# Patient Record
Sex: Male | Born: 1975 | Race: Black or African American | Hispanic: No | Marital: Single | Smoking: Never smoker
Health system: Southern US, Community
[De-identification: ages and names within clinical notes are randomized; demographics above are authoritative.]

## PROBLEM LIST (undated history)

## (undated) DIAGNOSIS — E119 Type 2 diabetes mellitus without complications: Secondary | ICD-10-CM

## (undated) DIAGNOSIS — E785 Hyperlipidemia, unspecified: Secondary | ICD-10-CM

## (undated) DIAGNOSIS — I10 Essential (primary) hypertension: Secondary | ICD-10-CM

## (undated) HISTORY — DX: Hyperlipidemia, unspecified: E78.5

---

## 2005-02-22 ENCOUNTER — Emergency Department (HOSPITAL_COMMUNITY): Admission: EM | Admit: 2005-02-22 | Discharge: 2005-02-23 | Payer: Self-pay | Admitting: Emergency Medicine

## 2005-02-23 ENCOUNTER — Emergency Department (HOSPITAL_COMMUNITY): Admission: EM | Admit: 2005-02-23 | Discharge: 2005-02-23 | Payer: Self-pay | Admitting: Emergency Medicine

## 2010-06-28 ENCOUNTER — Emergency Department (HOSPITAL_COMMUNITY): Admission: EM | Admit: 2010-06-28 | Discharge: 2010-06-29 | Payer: Self-pay | Admitting: Emergency Medicine

## 2010-07-02 ENCOUNTER — Inpatient Hospital Stay (HOSPITAL_COMMUNITY): Admission: EM | Admit: 2010-07-02 | Discharge: 2010-07-04 | Payer: Self-pay | Admitting: Emergency Medicine

## 2010-07-21 ENCOUNTER — Encounter (INDEPENDENT_AMBULATORY_CARE_PROVIDER_SITE_OTHER): Payer: Self-pay | Admitting: *Deleted

## 2010-07-21 ENCOUNTER — Ambulatory Visit: Payer: Self-pay | Admitting: Internal Medicine

## 2010-07-21 LAB — CONVERTED CEMR LAB
Hgb A1c MFr Bld: 12.6 % — ABNORMAL HIGH (ref <5.7)
Microalb, Ur: 0.87 mg/dL (ref 0.00–1.89)

## 2010-07-31 ENCOUNTER — Emergency Department (HOSPITAL_COMMUNITY): Admission: EM | Admit: 2010-07-31 | Discharge: 2010-08-01 | Payer: Self-pay | Admitting: Emergency Medicine

## 2010-08-19 DIAGNOSIS — R109 Unspecified abdominal pain: Secondary | ICD-10-CM | POA: Insufficient documentation

## 2010-08-19 DIAGNOSIS — E669 Obesity, unspecified: Secondary | ICD-10-CM

## 2010-08-19 DIAGNOSIS — R5381 Other malaise: Secondary | ICD-10-CM

## 2010-08-19 DIAGNOSIS — K7689 Other specified diseases of liver: Secondary | ICD-10-CM

## 2010-08-19 DIAGNOSIS — I10 Essential (primary) hypertension: Secondary | ICD-10-CM | POA: Insufficient documentation

## 2010-08-19 DIAGNOSIS — E1165 Type 2 diabetes mellitus with hyperglycemia: Secondary | ICD-10-CM

## 2010-08-19 DIAGNOSIS — E1110 Type 2 diabetes mellitus with ketoacidosis without coma: Secondary | ICD-10-CM

## 2010-08-19 DIAGNOSIS — R630 Anorexia: Secondary | ICD-10-CM

## 2010-08-19 DIAGNOSIS — R5383 Other fatigue: Secondary | ICD-10-CM

## 2010-09-12 ENCOUNTER — Encounter (INDEPENDENT_AMBULATORY_CARE_PROVIDER_SITE_OTHER): Payer: Self-pay | Admitting: *Deleted

## 2010-11-24 NOTE — Letter (Signed)
Summary: Appointment - Missed  [facility], Main Office  [address]   [place], [place] [address]   Phone: [phone]  Fax: [phone]     September 12, 2010 MRN: [id number]   Daniel Ewing [address] [place], [place]  [address]   Dear Mr. KOMAN,  Our records indicate you missed your appointment in October with Dr. Clifton James.                                    It is very important that we reach you to reschedule this appointment. We look forward to participating in your health care needs. Please contact us at the number listed above at your earliest convenience to reschedule this appointment.     Sincerely, Neurosurgeon Team LG

## 2011-01-05 LAB — CBC
HCT: 43 % (ref 39.0–52.0)
HCT: 45.4 % (ref 39.0–52.0)
HCT: 53.3 % — ABNORMAL HIGH (ref 39.0–52.0)
HCT: 54.8 % — ABNORMAL HIGH (ref 39.0–52.0)
Hemoglobin: 14.3 g/dL (ref 13.0–17.0)
Hemoglobin: 18.3 g/dL — ABNORMAL HIGH (ref 13.0–17.0)
Hemoglobin: 18.4 g/dL — ABNORMAL HIGH (ref 13.0–17.0)
MCH: 29 pg (ref 26.0–34.0)
MCH: 28.7 pg (ref 26.0–34.0)
MCH: 29.2 pg (ref 26.0–34.0)
MCH: 29.3 pg (ref 26.0–34.0)
MCHC: 33.3 g/dL (ref 30.0–36.0)
MCHC: 33.6 g/dL (ref 30.0–36.0)
MCV: 86.9 fL (ref 78.0–100.0)
MCV: 85 fL (ref 78.0–100.0)
MCV: 85 fL (ref 78.0–100.0)
MCV: 85.6 fL (ref 78.0–100.0)
Platelets: 246 10*3/uL (ref 150–400)
Platelets: 189 10*3/uL (ref 150–400)
Platelets: 215 10*3/uL (ref 150–400)
Platelets: 229 10*3/uL (ref 150–400)
RBC: 4.94 MIL/uL (ref 4.22–5.81)
RBC: 5.34 MIL/uL (ref 4.22–5.81)
RBC: 5.74 MIL/uL (ref 4.22–5.81)
RBC: 6.27 MIL/uL — ABNORMAL HIGH (ref 4.22–5.81)
RBC: 6.41 MIL/uL — ABNORMAL HIGH (ref 4.22–5.81)
RDW: 14.7 % (ref 11.5–15.5)
RDW: 13.5 % (ref 11.5–15.5)
WBC: 9.2 10*3/uL (ref 4.0–10.5)
WBC: 6.8 10*3/uL (ref 4.0–10.5)
WBC: 7.5 10*3/uL (ref 4.0–10.5)
WBC: 9.1 10*3/uL (ref 4.0–10.5)
WBC: 9.2 10*3/uL (ref 4.0–10.5)

## 2011-01-05 LAB — POCT CARDIAC MARKERS
CKMB, poc: <1 ng/mL — ABNORMAL LOW (ref 1.0–8.0)
CKMB, poc: <1 ng/mL — ABNORMAL LOW (ref 1.0–8.0)
CKMB, poc: <1 ng/mL — ABNORMAL LOW (ref 1.0–8.0)
Myoglobin, poc: 38.6 ng/mL (ref 12–200)
Myoglobin, poc: 48.4 ng/mL (ref 12–200)
Myoglobin, poc: 106 ng/mL (ref 12–200)
Troponin i, poc: <0.05 ng/mL (ref 0.00–0.09)
Troponin i, poc: <0.05 ng/mL (ref 0.00–0.09)
Troponin i, poc: <0.05 ng/mL (ref 0.00–0.09)
Troponin i, poc: <0.05 ng/mL (ref 0.00–0.09)

## 2011-01-05 LAB — GLUCOSE, CAPILLARY
Glucose-Capillary: 137 mg/dL — ABNORMAL HIGH (ref 70–99)
Glucose-Capillary: 154 mg/dL — ABNORMAL HIGH (ref 70–99)
Glucose-Capillary: 158 mg/dL — ABNORMAL HIGH (ref 70–99)
Glucose-Capillary: 162 mg/dL — ABNORMAL HIGH (ref 70–99)
Glucose-Capillary: 166 mg/dL — ABNORMAL HIGH (ref 70–99)
Glucose-Capillary: 173 mg/dL — ABNORMAL HIGH (ref 70–99)
Glucose-Capillary: 193 mg/dL — ABNORMAL HIGH (ref 70–99)
Glucose-Capillary: 247 mg/dL — ABNORMAL HIGH (ref 70–99)
Glucose-Capillary: 250 mg/dL — ABNORMAL HIGH (ref 70–99)
Glucose-Capillary: 271 mg/dL — ABNORMAL HIGH (ref 70–99)
Glucose-Capillary: 276 mg/dL — ABNORMAL HIGH (ref 70–99)
Glucose-Capillary: 291 mg/dL — ABNORMAL HIGH (ref 70–99)
Glucose-Capillary: 303 mg/dL — ABNORMAL HIGH (ref 70–99)
Glucose-Capillary: 339 mg/dL — ABNORMAL HIGH (ref 70–99)
Glucose-Capillary: 366 mg/dL — ABNORMAL HIGH (ref 70–99)
Glucose-Capillary: 367 mg/dL — ABNORMAL HIGH (ref 70–99)
Glucose-Capillary: 376 mg/dL — ABNORMAL HIGH (ref 70–99)

## 2011-01-05 LAB — COMPREHENSIVE METABOLIC PANEL
ALT: 43 U/L (ref 0–53)
ALT: 51 U/L (ref 0–53)
AST: 38 U/L — ABNORMAL HIGH (ref 0–37)
AST: 48 U/L — ABNORMAL HIGH (ref 0–37)
Albumin: 3.9 g/dL (ref 3.5–5.2)
Albumin: 4.7 g/dL (ref 3.5–5.2)
Alkaline Phosphatase: 69 U/L (ref 39–117)
BUN: 12 mg/dL (ref 6–23)
CO2: 23 mEq/L (ref 19–32)
Calcium: 9.6 mg/dL (ref 8.4–10.5)
Chloride: 111 mEq/L (ref 96–112)
Chloride: 94 mEq/L — ABNORMAL LOW (ref 96–112)
Creatinine, Ser: 1.06 mg/dL (ref 0.4–1.5)
Creatinine, Ser: 1.4 mg/dL (ref 0.4–1.5)
GFR calc Af Amer: >60 mL/min (ref >60)
GFR calc Af Amer: >60 mL/min (ref >60)
GFR calc non Af Amer: 59 mL/min — ABNORMAL LOW (ref >60)
Glucose, Bld: 385 mg/dL — ABNORMAL HIGH (ref 70–99)
Potassium: 3.6 mEq/L (ref 3.5–5.1)
Potassium: 4.2 mEq/L (ref 3.5–5.1)
Sodium: 135 mEq/L (ref 135–145)
Sodium: 140 mEq/L (ref 135–145)
Total Bilirubin: 1.1 mg/dL (ref 0.3–1.2)
Total Bilirubin: 1.6 mg/dL — ABNORMAL HIGH (ref 0.3–1.2)
Total Protein: 8.8 g/dL — ABNORMAL HIGH (ref 6.0–8.3)

## 2011-01-05 LAB — DIFFERENTIAL
Basophils Absolute: 0 10*3/uL (ref 0.0–0.1)
Basophils Absolute: 0 10*3/uL (ref 0.0–0.1)
Basophils Relative: 0 % (ref 0–1)
Basophils Relative: 1 % (ref 0–1)
Eosinophils Absolute: 0.1 10*3/uL (ref 0.0–0.7)
Eosinophils Absolute: 0 10*3/uL (ref 0.0–0.7)
Eosinophils Absolute: 0 10*3/uL (ref 0.0–0.7)
Eosinophils Relative: 1 % (ref 0–5)
Eosinophils Relative: 0 % (ref 0–5)
Eosinophils Relative: 0 % (ref 0–5)
Lymphocytes Relative: 39 % (ref 12–46)
Lymphocytes Relative: 27 % (ref 12–46)
Lymphocytes Relative: 27 % (ref 12–46)
Lymphs Abs: 3.6 10*3/uL (ref 0.7–4.0)
Lymphs Abs: 2.5 10*3/uL (ref 0.7–4.0)
Lymphs Abs: 2.5 10*3/uL (ref 0.7–4.0)
Monocytes Absolute: 0.5 10*3/uL (ref 0.1–1.0)
Monocytes Absolute: 0.3 10*3/uL (ref 0.1–1.0)
Monocytes Absolute: 0.3 10*3/uL (ref 0.1–1.0)
Monocytes Relative: 5 % (ref 3–12)
Monocytes Relative: 3 % (ref 3–12)
Monocytes Relative: 4 % (ref 3–12)
Neutro Abs: 5 10*3/uL (ref 1.7–7.7)
Neutro Abs: 6.2 10*3/uL (ref 1.7–7.7)
Neutrophils Relative %: 54 % (ref 43–77)
Neutrophils Relative %: 68 % (ref 43–77)

## 2011-01-05 LAB — URINE CULTURE: Culture: NO GROWTH

## 2011-01-05 LAB — BASIC METABOLIC PANEL
BUN: 14 mg/dL (ref 6–23)
CO2: 25 mEq/L (ref 19–32)
CO2: 16 mEq/L — ABNORMAL LOW (ref 19–32)
Calcium: 9 mg/dL (ref 8.4–10.5)
Chloride: 107 mEq/L (ref 96–112)
Chloride: 114 mEq/L — ABNORMAL HIGH (ref 96–112)
Creatinine, Ser: 0.99 mg/dL (ref 0.4–1.5)
GFR calc Af Amer: >60 mL/min (ref >60)
GFR calc Af Amer: >60 mL/min (ref >60)
GFR calc non Af Amer: >60 mL/min (ref >60)
Glucose, Bld: 99 mg/dL (ref 70–99)
Potassium: 3.6 mEq/L (ref 3.5–5.1)
Potassium: 3.3 mEq/L — ABNORMAL LOW (ref 3.5–5.1)
Sodium: 138 mEq/L (ref 135–145)

## 2011-01-05 LAB — POCT I-STAT, CHEM 8
BUN: 17 mg/dL (ref 6–23)
Creatinine, Ser: 1 mg/dL (ref 0.4–1.5)
Potassium: 4.7 mEq/L (ref 3.5–5.1)
Sodium: 137 mEq/L (ref 135–145)
TCO2: 16 mmol/L (ref 0–100)

## 2011-01-05 LAB — URINALYSIS, ROUTINE W REFLEX MICROSCOPIC
Bilirubin Urine: NEGATIVE
Glucose, UA: >1000 mg/dL — AB
Glucose, UA: >1000 mg/dL — AB
Hgb urine dipstick: NEGATIVE
Ketones, ur: 40 mg/dL — AB
Ketones, ur: >80 mg/dL — AB
Leukocytes, UA: NEGATIVE
Leukocytes, UA: NEGATIVE
Nitrite: NEGATIVE
Nitrite: NEGATIVE
Protein, ur: 100 mg/dL — AB
Protein, ur: 30 mg/dL — AB
Specific Gravity, Urine: 1.037 — ABNORMAL HIGH (ref 1.005–1.030)
Specific Gravity, Urine: 1.041 — ABNORMAL HIGH (ref 1.005–1.030)
Urobilinogen, UA: 0.2 mg/dL (ref 0.0–1.0)
Urobilinogen, UA: 0.2 mg/dL (ref 0.0–1.0)
pH: 5 (ref 5.0–8.0)
pH: 5.5 (ref 5.0–8.0)

## 2011-01-05 LAB — URINE MICROSCOPIC-ADD ON

## 2011-01-05 LAB — BLOOD GAS, ARTERIAL
Acid-base deficit: 13.5 mmol/L — ABNORMAL HIGH (ref 0.0–2.0)
Drawn by: 235321
O2 Content: 2 L/min
O2 Saturation: 97.8 %
pCO2 arterial: 24.5 mmHg — ABNORMAL LOW (ref 35.0–45.0)
pO2, Arterial: 104 mmHg — ABNORMAL HIGH (ref 80.0–100.0)

## 2011-01-05 LAB — CARDIAC PANEL(CRET KIN+CKTOT+MB+TROPI)
CK, MB: 1.2 ng/mL (ref 0.3–4.0)
Relative Index: 1.1 (ref 0.0–2.5)
Troponin I: 0.02 ng/mL (ref 0.00–0.06)

## 2011-01-05 LAB — LIPASE, BLOOD: Lipase: 23 U/L (ref 11–59)

## 2011-06-06 ENCOUNTER — Emergency Department (HOSPITAL_COMMUNITY)
Admission: EM | Admit: 2011-06-06 | Discharge: 2011-06-07 | Disposition: A | Discharge status: Home / Self Care | Payer: PRIVATE HEALTH INSURANCE | Attending: Emergency Medicine | Admitting: Emergency Medicine

## 2011-06-06 DIAGNOSIS — E119 Type 2 diabetes mellitus without complications: Secondary | ICD-10-CM | POA: Insufficient documentation

## 2011-06-06 DIAGNOSIS — X58XXXA Exposure to other specified factors, initial encounter: Secondary | ICD-10-CM | POA: Insufficient documentation

## 2011-06-06 DIAGNOSIS — Z23 Encounter for immunization: Secondary | ICD-10-CM | POA: Insufficient documentation

## 2011-06-06 DIAGNOSIS — S91109A Unspecified open wound of unspecified toe(s) without damage to nail, initial encounter: Secondary | ICD-10-CM | POA: Insufficient documentation

## 2011-06-06 DIAGNOSIS — I10 Essential (primary) hypertension: Secondary | ICD-10-CM | POA: Insufficient documentation

## 2012-05-30 IMAGING — CT CT ANGIO CHEST
2 of 6 series · 19 of 36 positions shown · IV contrast (APPLIED)
Comparison: None.

CLINICAL DATA: Chest pain and shortness of breath.

CT ANGIOGRAPHY CHEST WITH CONTRAST
TECHNIQUE: Multidetector CT imaging of the chest was performed
using the standard protocol during bolus administration of
intravenous contrast.  Multiplanar CT image reconstructions
including MIPs were obtained to evaluate the vascular anatomy.
Contrast:  100 ml intravenous Rmnipaque-TCC

[Series 6: thins · axial · 0.74mm/px · z∈[-329,-73]mm · 18 of 286 slices shown]
[im 15/286  lung]
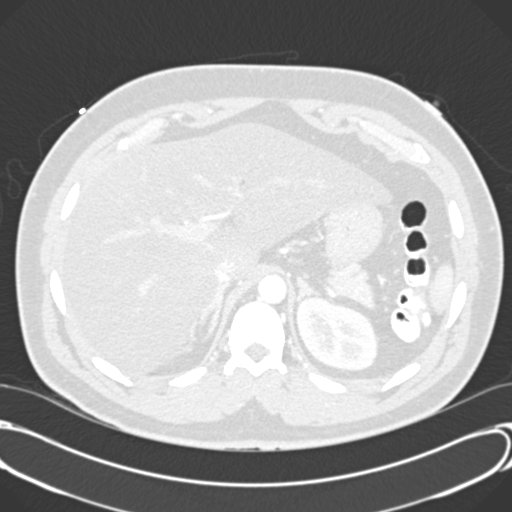
[im 29/286  mediastinal]
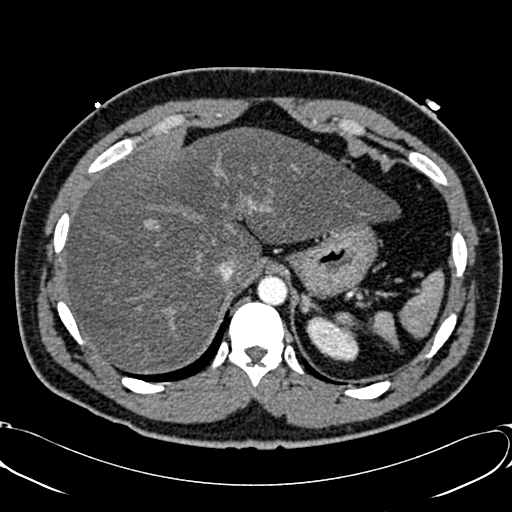
[im 43/286  lung]
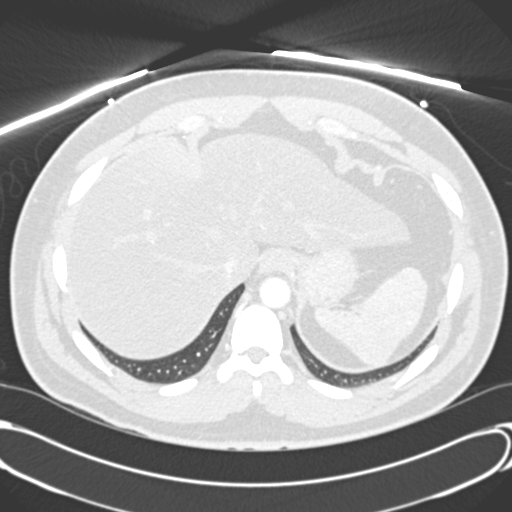
[im 58/286  mediastinal]
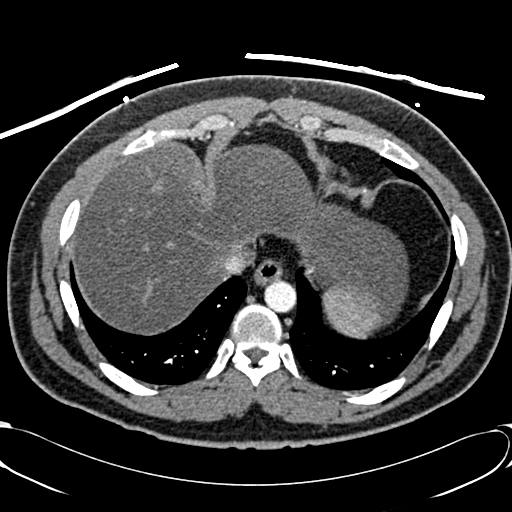
[im 72/286  lung]
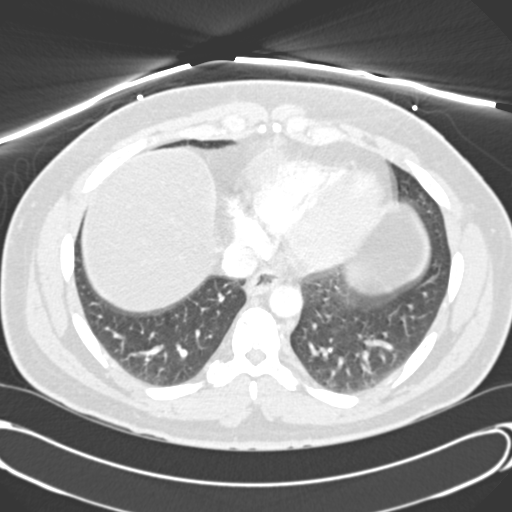
[im 86/286  mediastinal]
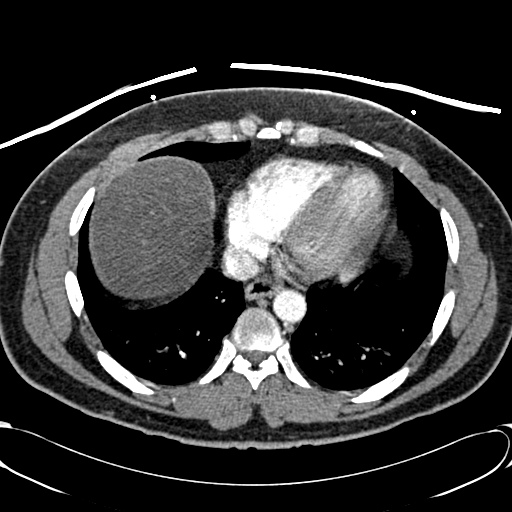
[im 100/286  lung]
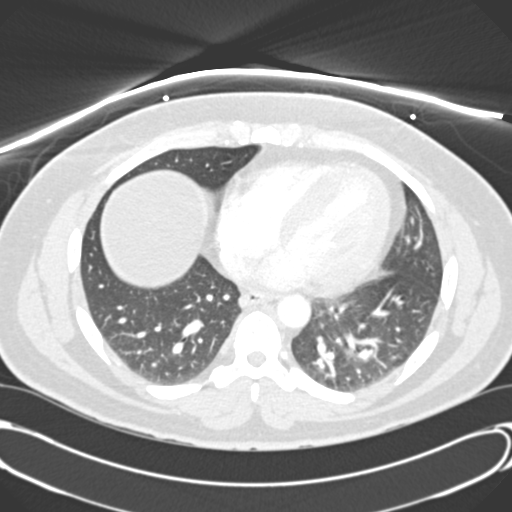
[im 115/286  mediastinal]
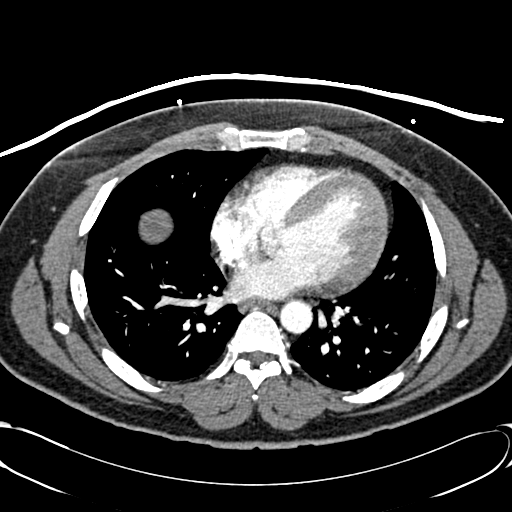
[im 129/286  lung]
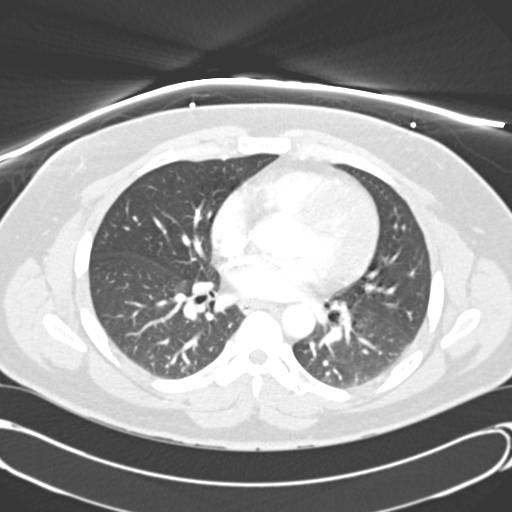
[im 157/286  mediastinal]
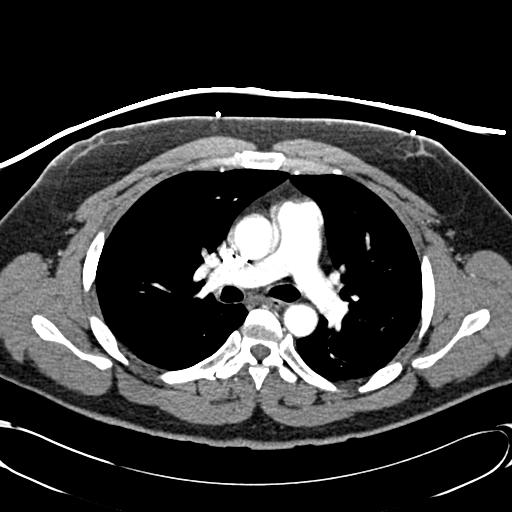
[im 172/286  lung]
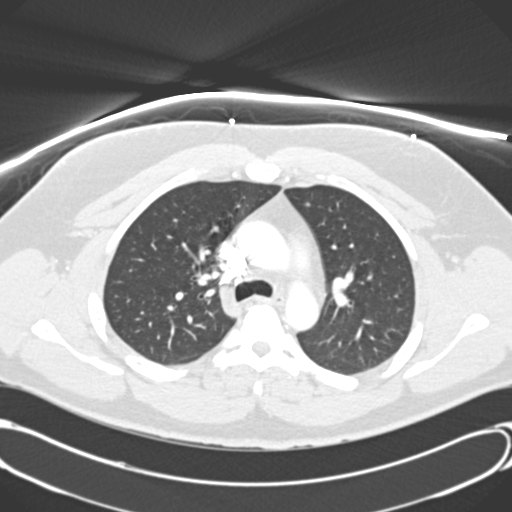
[im 186/286  mediastinal]
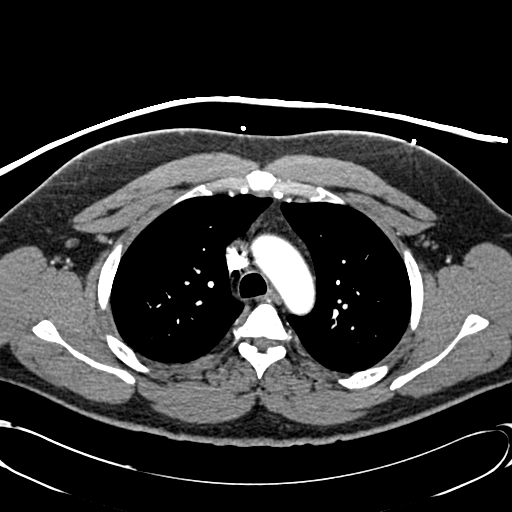
[im 200/286  lung]
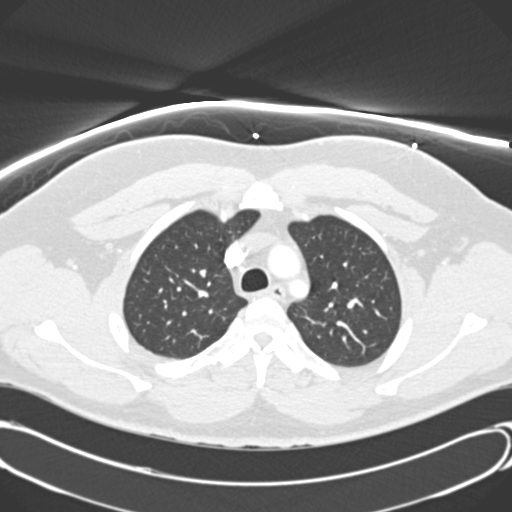
[im 214/286  mediastinal]
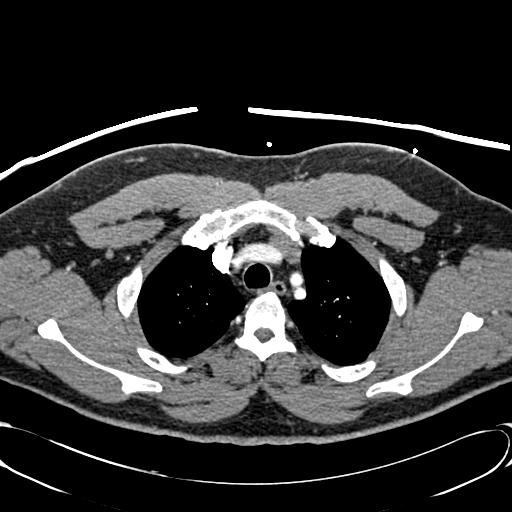
[im 229/286  lung]
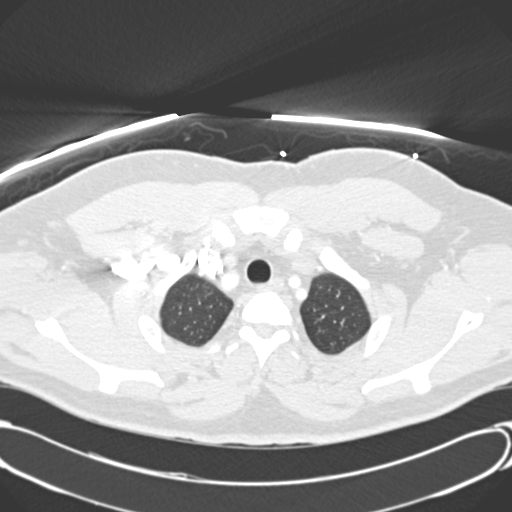
[im 243/286  mediastinal]
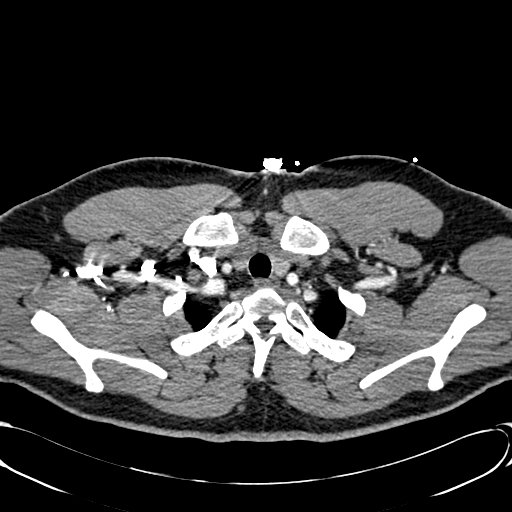
[im 257/286  lung]
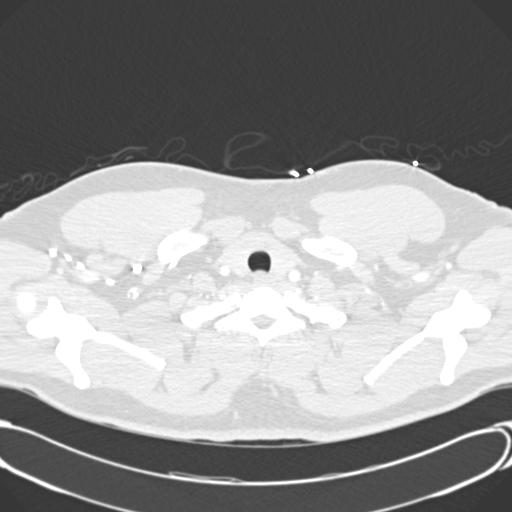
[im 271/286  mediastinal]
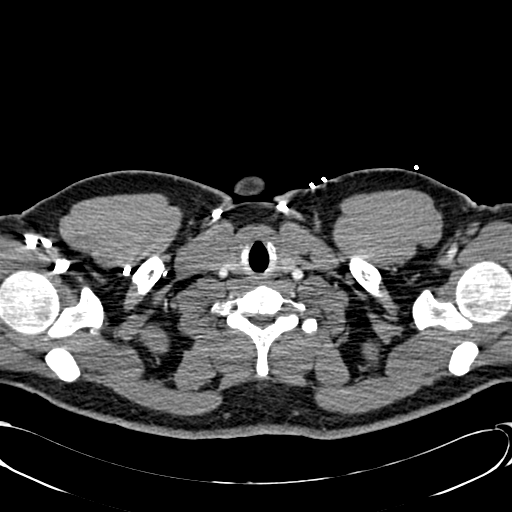

[Series 602: coronal chest · coronal · 0.74mm/px · 1 of 105 slices shown]
[im 53/105  mediastinal]
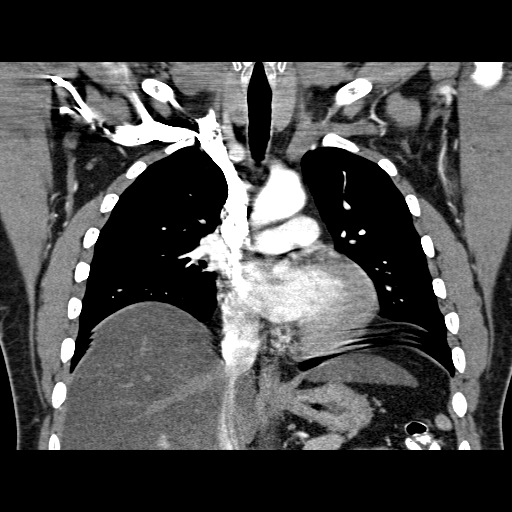

[19 of 36 positions shown; findings below may reference images not displayed]

FINDINGS: This is a technically adequate study.

No pulmonary emboli are identified.
There is no evidence of thoracic aortic aneurysm or dissection.
Mild cardiomegaly is identified.
The great vessels are within normal limits.
No pleural or pericardial effusions are identified.

The lungs are clear without evidence of nodules, masses, airspace
disease or consolidation.

Fatty infiltration of the liver is identified.
No acute or suspicious bony abnormalities are identified.

Review of the MIP images confirms the above findings.
IMPRESSION: No evidence of acute abnormality.  No evidence of pulmonary emboli
or thoracic aortic aneurysm/dissection.

Fatty infiltration of the liver.

Mild cardiomegaly.

## 2012-05-31 IMAGING — US US ABDOMEN COMPLETE
1 series · 14 of 25 positions shown · non-contrast
Comparison: None.

CLINICAL DATA: Abdominal pain.  Nausea and vomiting.  Diabetes and
obesity.

ABDOMINAL ULTRASOUND COMPLETE

[Series 1: us abdomen complete · 0.30mm/px · 14 of 45 slices shown]
[im 1/45]
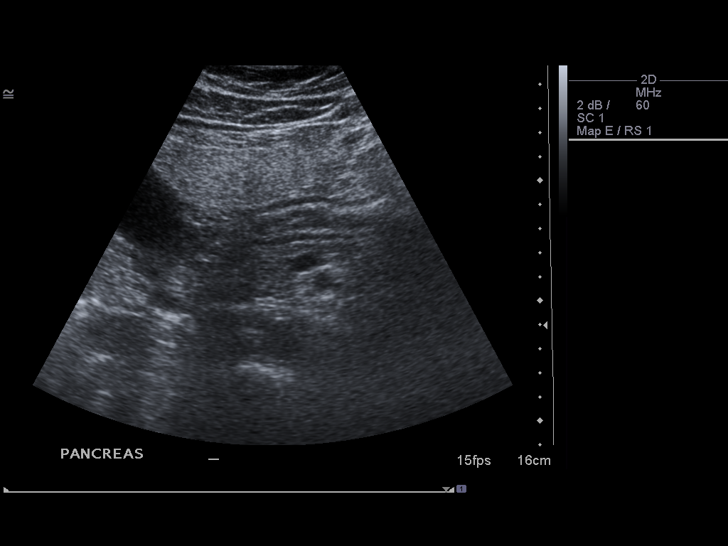
[im 4/45]
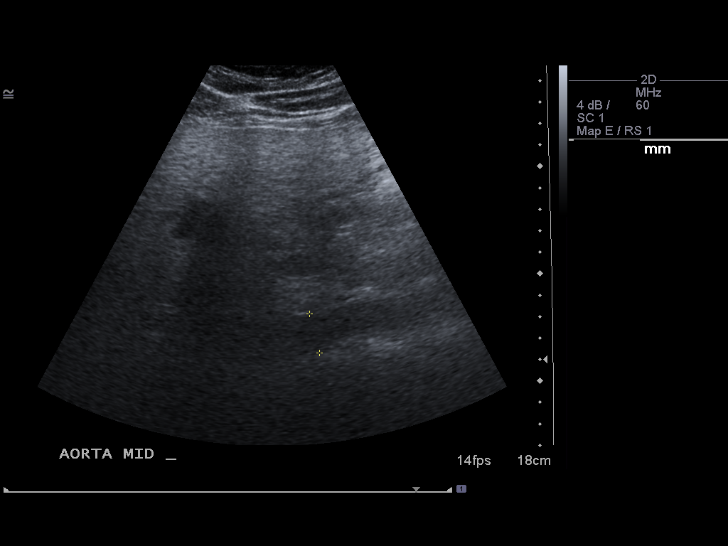
[im 8/45]
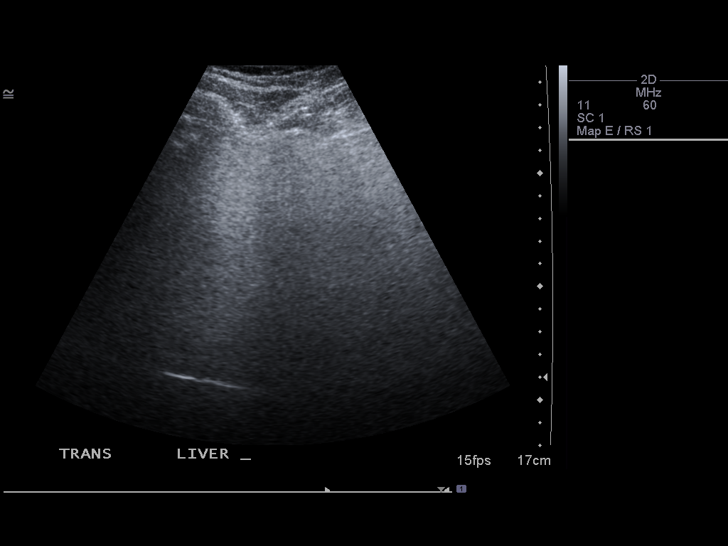
[im 12/45]
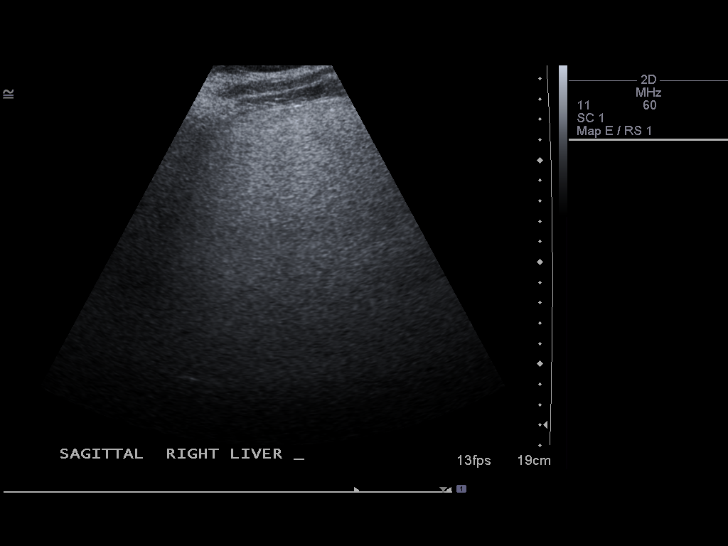
[im 15/45]
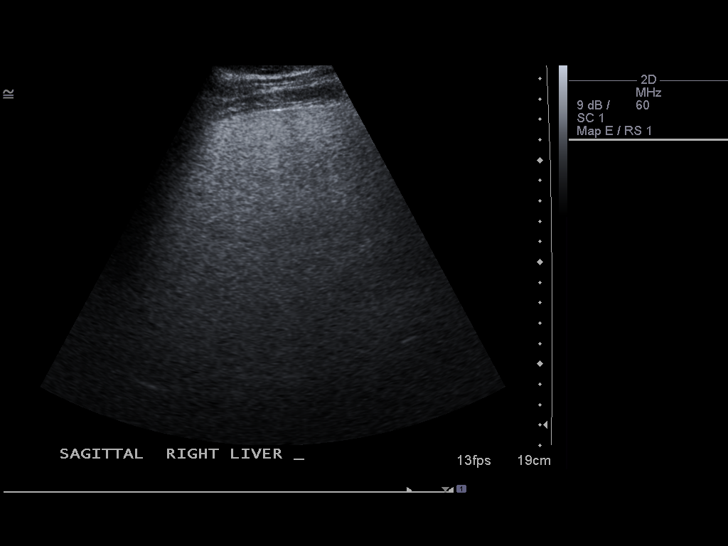
[im 17/45]
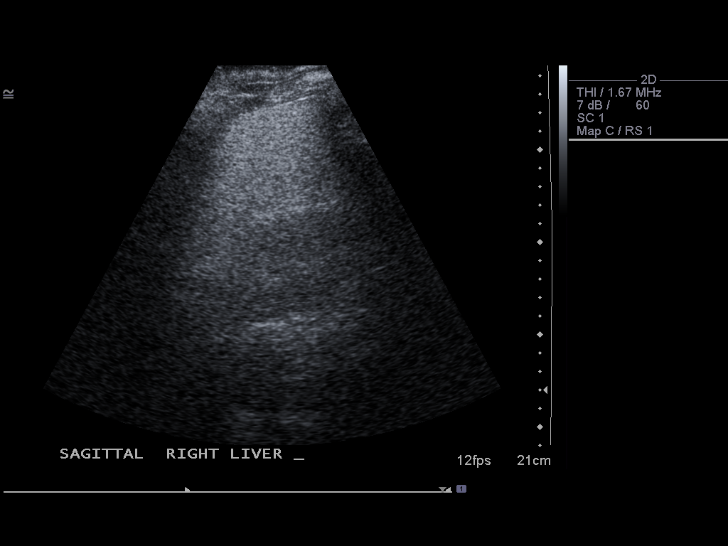
[im 21/45]
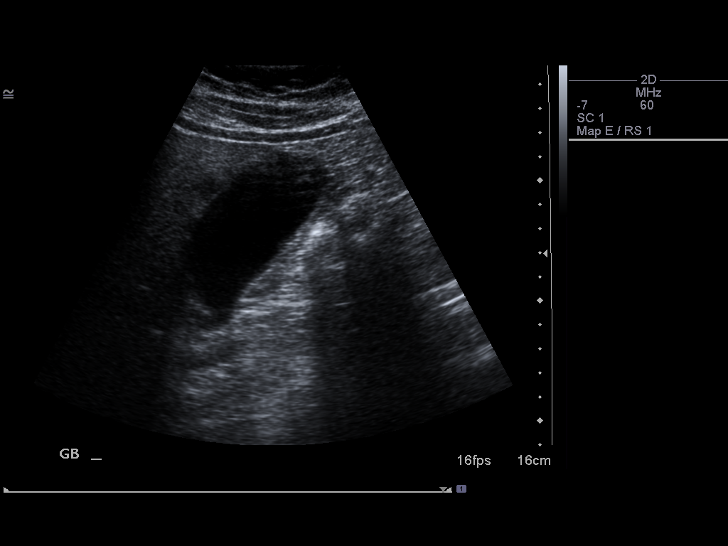
[im 24/45]
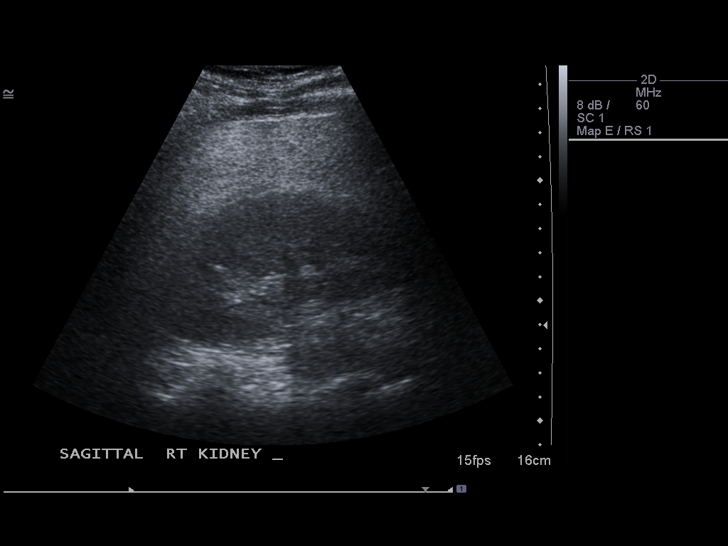
[im 28/45]
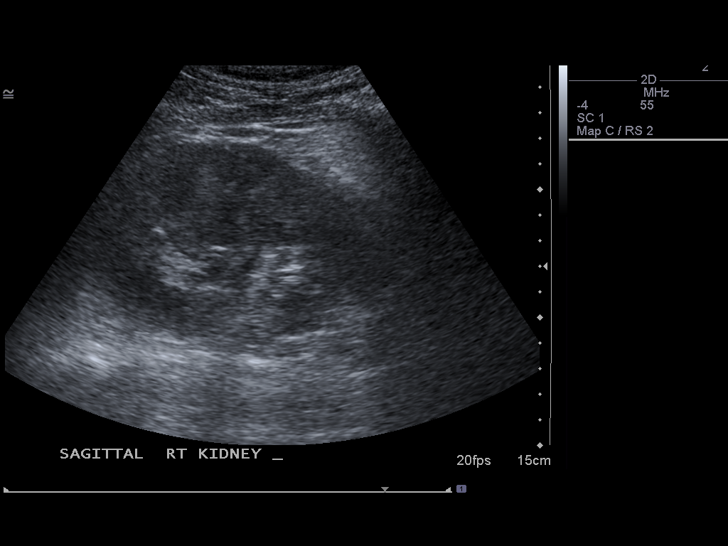
[im 30/45]
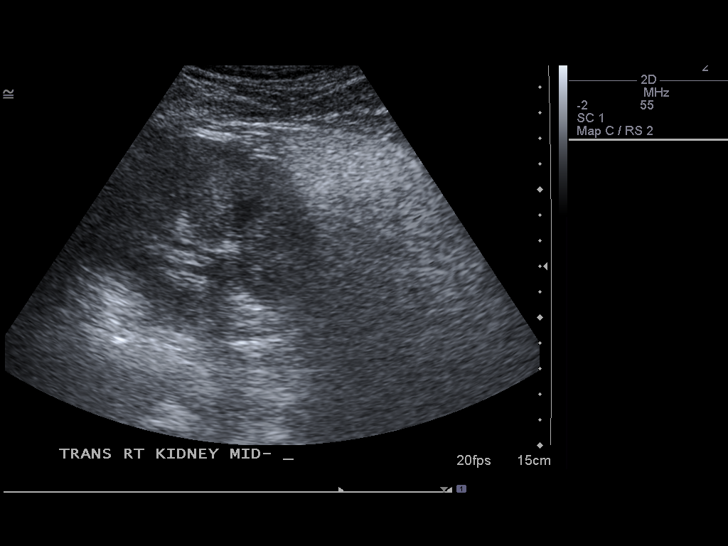
[im 34/45]
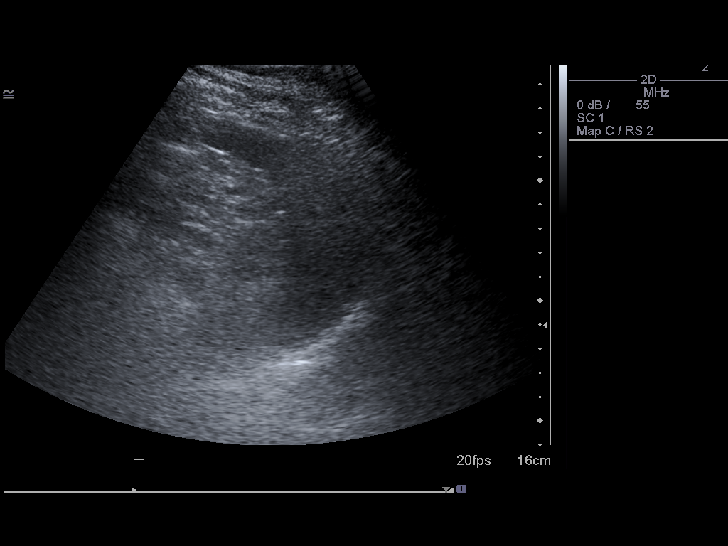
[im 37/45]
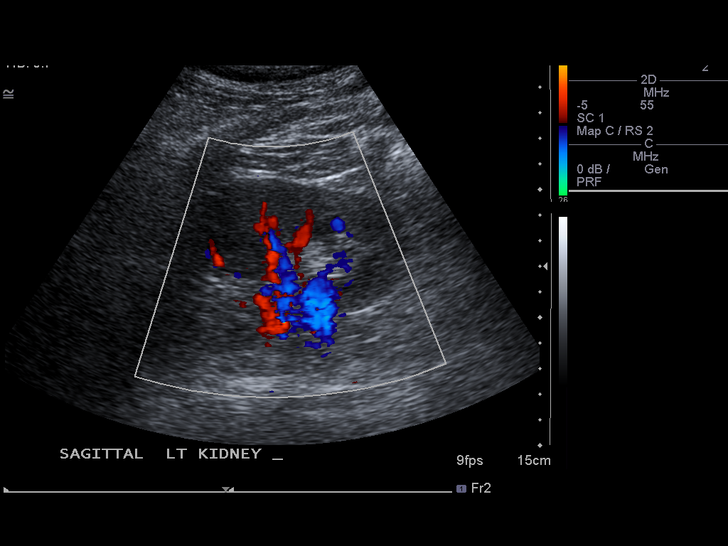
[im 41/45]
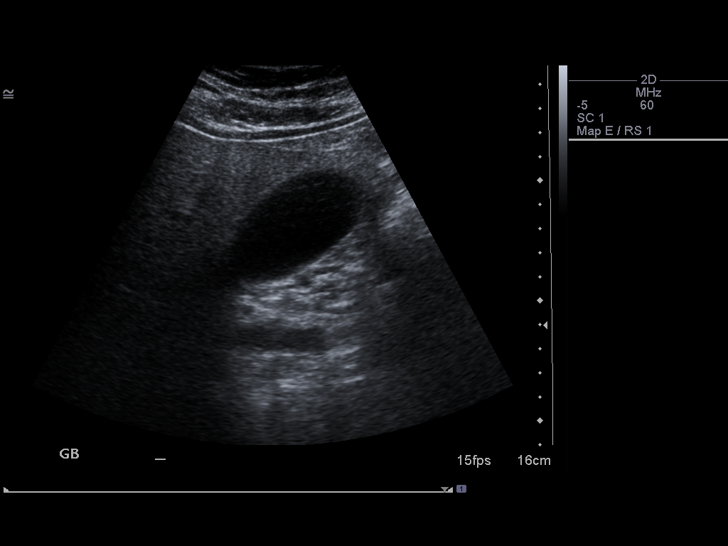
[im 45/45]
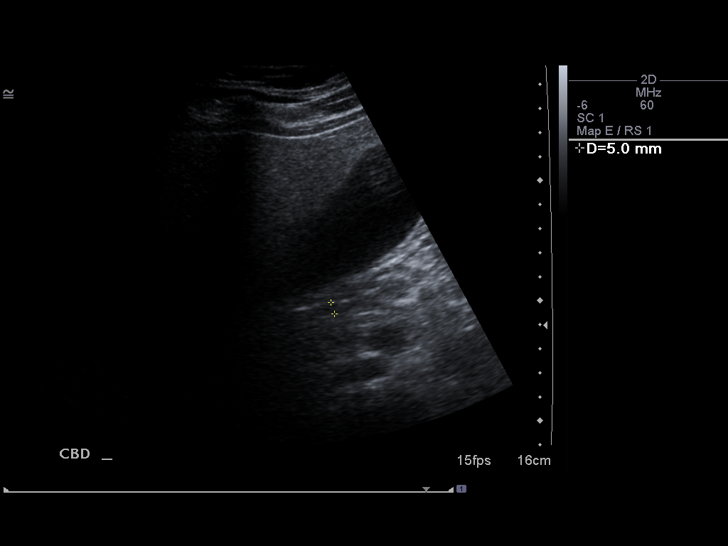

[14 of 25 positions shown; findings below may reference images not displayed]

FINDINGS: Gallbladder:  No gallstones, gallbladder wall thickening, or
pericholecystic fluid.

Common Bile Duct:  Within normal limits in caliber. Measures 5 mm
in diameter.

Liver: Diffusely increased parenchymal echogenicity, consistent
with hepatic steatosis.  No focal liver mass identified.

IVC:  Appears normal.

Pancreas:  Not well visualized due to overlying bowel gas.

Spleen:  Within normal limits in size and echotexture.

Right kidney:  Normal in size and parenchymal echogenicity.  No
evidence of mass or hydronephrosis.

Left kidney:  Normal in size and parenchymal echogenicity.  No
evidence of mass or hydronephrosis.

Abdominal Aorta:  No aneurysm identified.
IMPRESSION: 1.  No evidence of gallstones or other acute findings.
2.  Hepatic steatosis.

## 2012-10-01 ENCOUNTER — Emergency Department (HOSPITAL_COMMUNITY): Payer: No Typology Code available for payment source

## 2012-10-01 ENCOUNTER — Emergency Department (HOSPITAL_COMMUNITY)
Admission: EM | Admit: 2012-10-01 | Discharge: 2012-10-01 | Disposition: A | Discharge status: Home / Self Care | Payer: No Typology Code available for payment source | Attending: Emergency Medicine | Admitting: Emergency Medicine

## 2012-10-01 ENCOUNTER — Encounter (HOSPITAL_COMMUNITY): Payer: Self-pay | Admitting: Emergency Medicine

## 2012-10-01 DIAGNOSIS — Z791 Long term (current) use of non-steroidal anti-inflammatories (NSAID): Secondary | ICD-10-CM | POA: Insufficient documentation

## 2012-10-01 DIAGNOSIS — Y92410 Unspecified street and highway as the place of occurrence of the external cause: Secondary | ICD-10-CM | POA: Insufficient documentation

## 2012-10-01 DIAGNOSIS — M542 Cervicalgia: Secondary | ICD-10-CM

## 2012-10-01 DIAGNOSIS — Z79899 Other long term (current) drug therapy: Secondary | ICD-10-CM | POA: Insufficient documentation

## 2012-10-01 DIAGNOSIS — M25519 Pain in unspecified shoulder: Secondary | ICD-10-CM

## 2012-10-01 DIAGNOSIS — I10 Essential (primary) hypertension: Secondary | ICD-10-CM | POA: Insufficient documentation

## 2012-10-01 DIAGNOSIS — S4980XA Other specified injuries of shoulder and upper arm, unspecified arm, initial encounter: Secondary | ICD-10-CM | POA: Insufficient documentation

## 2012-10-01 DIAGNOSIS — S46909A Unspecified injury of unspecified muscle, fascia and tendon at shoulder and upper arm level, unspecified arm, initial encounter: Secondary | ICD-10-CM | POA: Insufficient documentation

## 2012-10-01 DIAGNOSIS — E119 Type 2 diabetes mellitus without complications: Secondary | ICD-10-CM | POA: Insufficient documentation

## 2012-10-01 DIAGNOSIS — Y939 Activity, unspecified: Secondary | ICD-10-CM | POA: Insufficient documentation

## 2012-10-01 HISTORY — DX: Type 2 diabetes mellitus without complications: E11.9

## 2012-10-01 HISTORY — DX: Essential (primary) hypertension: I10

## 2012-10-01 MED ORDER — METHOCARBAMOL 500 MG PO TABS: 500.0000 mg | ORAL_TABLET | Freq: Two times a day (BID) | ORAL | Status: DC

## 2012-10-01 MED ORDER — HYDROCODONE-ACETAMINOPHEN 5-325 MG PO TABS: 1.0000 | ORAL_TABLET | ORAL | Status: DC | PRN

## 2012-10-01 MED ORDER — ACETAMINOPHEN 325 MG PO TABS
650.0000 mg | ORAL_TABLET | Freq: Once | ORAL | Status: AC
  Administered 2012-10-01: 650 mg via ORAL
  Filled 2012-10-01: qty 2

## 2012-10-01 MED ORDER — IBUPROFEN 800 MG PO TABS: 800.0000 mg | ORAL_TABLET | Freq: Three times a day (TID) | ORAL | Status: DC

## 2012-10-01 NOTE — ED Provider Notes (Signed)
Medical screening examination/treatment/procedure(s) were performed by non-physician practitioner and as supervising physician I was immediately available for consultation/collaboration.  Justine Cossin R. Renny Remer, MD 10/01/12 1631 
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
## 2012-10-01 NOTE — ED Notes (Signed)
CBG=101, Marlon Pel, PA aware, will monitor.
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
## 2015-12-03 ENCOUNTER — Emergency Department (HOSPITAL_COMMUNITY): Payer: PRIVATE HEALTH INSURANCE

## 2015-12-03 ENCOUNTER — Emergency Department (HOSPITAL_COMMUNITY)
Admission: EM | Admit: 2015-12-03 | Discharge: 2015-12-03 | Disposition: A | Discharge status: Home / Self Care | Payer: PRIVATE HEALTH INSURANCE | Attending: Emergency Medicine | Admitting: Emergency Medicine

## 2015-12-03 ENCOUNTER — Encounter (HOSPITAL_COMMUNITY): Payer: Self-pay

## 2015-12-03 DIAGNOSIS — Z79899 Other long term (current) drug therapy: Secondary | ICD-10-CM | POA: Insufficient documentation

## 2015-12-03 DIAGNOSIS — Z7984 Long term (current) use of oral hypoglycemic drugs: Secondary | ICD-10-CM | POA: Insufficient documentation

## 2015-12-03 DIAGNOSIS — M79671 Pain in right foot: Secondary | ICD-10-CM

## 2015-12-03 DIAGNOSIS — E119 Type 2 diabetes mellitus without complications: Secondary | ICD-10-CM | POA: Insufficient documentation

## 2015-12-03 DIAGNOSIS — I10 Essential (primary) hypertension: Secondary | ICD-10-CM | POA: Insufficient documentation

## 2015-12-03 DIAGNOSIS — Z7982 Long term (current) use of aspirin: Secondary | ICD-10-CM | POA: Insufficient documentation

## 2015-12-03 DIAGNOSIS — M722 Plantar fascial fibromatosis: Secondary | ICD-10-CM | POA: Insufficient documentation

## 2015-12-03 MED ORDER — NAPROXEN 500 MG PO TABS: 500.0000 mg | ORAL_TABLET | Freq: Two times a day (BID) | ORAL | Status: DC

## 2015-12-03 MED ORDER — NAPROXEN 500 MG PO TABS
500.0000 mg | ORAL_TABLET | Freq: Once | ORAL | Status: AC
  Administered 2015-12-03: 500 mg via ORAL
  Filled 2015-12-03: qty 1

## 2015-12-03 NOTE — ED Provider Notes (Signed)
CSN: [id number]     Arrival date & time 12/03/15  4098 History   First MD Initiated Contact with Patient 12/03/15 [phone]     Chief Complaint  Patient presents with  . Foot Pain   (Consider location/radiation/quality/duration/timing/severity/associated sxs/prior Treatment) Patient is a 40 y.o. male presenting with lower extremity pain. The history is provided by the patient. No language interpreter was used.  Foot Pain Associated symptoms include arthralgias. Pertinent negatives include no joint swelling or numbness.   Mr. Zentner is a 40 y.o male with a history of diabetes and hypertension who presents with sudden onset right foot pain after waking up from a nap. States he has not been able to a blade on it. Thursday the pain became worse. Worse taking the first step. 10/10 pain with ambulation. Has been using crutches the last 2 days. States he took Excedrin with last dose yesterday night. Denies any injury. Denies any swelling, bruising, numbness. Last A1c was 6.1. Denies any history of diabetic neuropathy. States he wears crocs or Nike tennis shoes. Professor by trade.   Past Medical History  Diagnosis Date  . Diabetes mellitus without complication (HCC)   . Hypertension    History reviewed. No pertinent past surgical history. Family History  Problem Relation Age of Onset  . Cancer Father    Social History  Substance Use Topics  . Smoking status: Never Smoker   . Smokeless tobacco: Never Used  . Alcohol Use: No    Review of Systems  Musculoskeletal: Positive for arthralgias. Negative for joint swelling.  Skin: Negative for wound.  Neurological: Negative for numbness.      Allergies  Review of patient's allergies indicates no known allergies.  Home Medications   Prior to Admission medications   Medication Sig Start Date End Date Taking? Authorizing Provider  aspirin 81 MG tablet Take 81 mg by mouth daily.   Yes Historical Provider, MD  Aspirin-Acetaminophen-Caffeine  (GOODY HEADACHE PO) Take 1 each by mouth.   Yes Historical Provider, MD  calcium & magnesium carbonates (MYLANTA) 478-295 MG tablet Take 1 tablet by mouth daily.   Yes Historical Provider, MD  lisinopril-hydrochlorothiazide (PRINZIDE,ZESTORETIC) 20-25 MG per tablet Take 1 tablet by mouth daily.   Yes Historical Provider, MD  metFORMIN (GLUCOPHAGE) 500 MG tablet Take 500 mg by mouth 2 (two) times daily with a meal.   Yes Historical Provider, MD  Omega-3 Fatty Acids (FISH OIL) 1000 MG CAPS Take 1,000 mg by mouth 3 (three) times daily.   Yes Historical Provider, MD  naproxen (NAPROSYN) 500 MG tablet Take 1 tablet (500 mg total) by mouth 2 (two) times daily. 12/03/15   Jerimey Burridge Patel-Mills, PA-C   BP 143/104 mmHg  Pulse 99  Temp(Src) 98.3 F (36.8 C) (Oral)  Resp 16  SpO2 99% Physical Exam  Constitutional: He is oriented to person, place, and time. He appears well-developed and well-nourished.  HENT:  Head: Normocephalic and atraumatic.  Eyes: Conjunctivae are normal.  Neck: Normal range of motion. Neck supple.  Cardiovascular: Normal rate.   Pulmonary/Chest: Effort normal.  Musculoskeletal: Normal range of motion. He exhibits tenderness. He exhibits no edema.  Right foot: 2+ DP pulse. Able to dorsi and plantar flex but with pain. Tenderness to the dorsum along the metatarsals and along the fifth metatarsal on the volar surface. No obvious deformity. Able to flex and extend toes. Less than 2 seconds capillary refill.  Neurological: He is alert and oriented to person, place, and time.  Skin: Skin is warm  and dry.  Psychiatric: He has a normal mood and affect.  Nursing note and vitals reviewed.   ED Course  Procedures (including critical care time) Labs Review Labs Reviewed - No data to display  Imaging Review Dg Foot Complete Right  12/03/2015  CLINICAL DATA:  Right lateral foot pain has been getting worse. EXAM: RIGHT FOOT COMPLETE - 3+ VIEW COMPARISON:  None. FINDINGS: There is no  evidence of fracture or dislocation. There is no evidence of arthropathy or other focal bone abnormality. Soft tissues are unremarkable. IMPRESSION: Negative. Electronically Signed   By: Elige Ko   On: 12/03/2015 09:33   I have personally reviewed and evaluated these image results as part of my medical decision-making.   EKG Interpretation None      MDM   Final diagnoses:  Foot pain, right  Plantar fasciitis of right foot   Patients symptoms consistent with plantar fascitis.  He is diabetic but has well controlled glucose and no peripheral neuropathy. No signs of infection.  Worse with first step after taking a nap. Xray is negative for any acute findings. I discussed conservative methods with him.  I also gave him naproxen and follow up with ortho.  Return precautions discussed and patient agrees with plan.  Medications  naproxen (NAPROSYN) tablet 500 mg (500 mg Oral Given 12/03/15 0929)   Filed Vitals:   12/03/15 0856  BP: 143/104  Pulse: 99  Temp: 98.3 F (36.8 C)  Resp: [address] PA-C 12/04/15 1610  Arby Barrette, MD 12/08/15 1032

## 2015-12-03 NOTE — Discharge Instructions (Signed)
Plantar Fasciitis Follow up with orthopedics. Try conservative treatments first such as running a frozen water bottle under your foot.  Plantar fasciitis is a painful foot condition that affects the heel. It occurs when the band of tissue that connects the toes to the heel bone (plantar fascia) becomes irritated. This can happen after exercising too much or doing other repetitive activities (overuse injury). The pain from plantar fasciitis can range from mild irritation to severe pain that makes it difficult for you to walk or move. The pain is usually worse in the morning or after you have been sitting or lying down for a while. CAUSES This condition may be caused by:  Standing for long periods of time.  Wearing shoes that do not fit.  Doing high-impact activities, including running, aerobics, and ballet.  Being overweight.  Having an abnormal way of walking (gait).  Having tight calf muscles.  Having high arches in your feet.  Starting a new athletic activity. SYMPTOMS The main symptom of this condition is heel pain. Other symptoms include:  Pain that gets worse after activity or exercise.  Pain that is worse in the morning or after resting.  Pain that goes away after you walk for a few minutes. DIAGNOSIS This condition may be diagnosed based on your signs and symptoms. Your health care provider will also do a physical exam to check for:  A tender area on the bottom of your foot.  A high arch in your foot.  Pain when you move your foot.  Difficulty moving your foot. You may also need to have imaging studies to confirm the diagnosis. These can include:  X-rays.  Ultrasound.  MRI. TREATMENT  Treatment for plantar fasciitis depends on the severity of the condition. Your treatment may include:  Rest, ice, and over-the-counter pain medicines to manage your pain.  Exercises to stretch your calves and your plantar fascia.  A splint that holds your foot in a stretched,  upward position while you sleep (night splint).  Physical therapy to relieve symptoms and prevent problems in the future.  Cortisone injections to relieve severe pain.  Extracorporeal shock wave therapy (ESWT) to stimulate damaged plantar fascia with electrical impulses. It is often used as a last resort before surgery.  Surgery, if other treatments have not worked after 12 months. HOME CARE INSTRUCTIONS  Take medicines only as directed by your health care provider.  Avoid activities that cause pain.  Roll the bottom of your foot over a bag of ice or a bottle of cold water. Do this for 20 minutes, 3-4 times a day.  Perform simple stretches as directed by your health care provider.  Try wearing athletic shoes with air-sole or gel-sole cushions or soft shoe inserts.  Wear a night splint while sleeping, if directed by your health care provider.  Keep all follow-up appointments with your health care provider. PREVENTION   Do not perform exercises or activities that cause heel pain.  Consider finding low-impact activities if you continue to have problems.  Lose weight if you need to. The best way to prevent plantar fasciitis is to avoid the activities that aggravate your plantar fascia. SEEK MEDICAL CARE IF:  Your symptoms do not go away after treatment with home care measures.  Your pain gets worse.  Your pain affects your ability to move or do your daily activities.   This information is not intended to replace advice given to you by your health care provider. Make sure you discuss any questions  you have with your health care provider.   Document Released: 07/04/2001 Document Revised: 06/30/2015 Document Reviewed: 08/19/2014 Elsevier Interactive Patient Education [employer]

## 2015-12-03 NOTE — ED Notes (Addendum)
Patient states he woke yesterday with right foot pain. Patient states today is unable to bear weight on the right foot without having severe pain. Patient states he last took Excedrin last night at 2245 with very little relief. Patient denies any injury to the right foot. No swelling or bruising noted.

## 2018-07-03 ENCOUNTER — Encounter: Payer: Self-pay | Admitting: Cardiology

## 2018-07-09 NOTE — Progress Notes (Signed)
Cardiology Office Note   Date:  07/10/2018   ID:  Daniel HaroldAnwar F Fessel, DOB 07/06/1976, MRN [id number]  PCP:  Diamantina ProvidenceAnderson, Takela N, FNP  Cardiologist:   Spike Desilets [place], MD   Chief Complaint  Patient presents with  . Chest Pain  . Palpitations      History of Present Illness: Daniel Haroldnwar F Ewing is a 42 y.o. male who is seen at the request of Dayton Scrapeakela  Anderson FNP for evaluation of palpitations, atrial fibrillation, and chest pain. He has a history of DM, HLD, and HTN. He reports he was in usual state of health until 2 weeks ago when he developed a sharp sudden pain in his chest while bending over. He also notes an irregular heart beat. He reports DM diagnosed in 2011. He has HTN which was controlled with lisinopril HCT. On statin for cholesterol. When seen at primary care office Ecg showed atrial fibrillation with rate 145 bpm. ST-T changes c/w inferior ischemia. He was started on metoprolol but has only been taking this once a day. Reports lisinopril HCT was stopped. Reports chest pain resolved. No dyspnea or dizziness. Prior to this was doing a cardio work out 5x/week. No history of murmur, bleeding, TIA, or CVA symptoms. Avoids caffeine. No Etoh or tobacco use. 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    Past Medical History:  Diagnosis Date  . Diabetes mellitus without complication (HCC)   . Hyperlipidemia   . Hypertension     History reviewed. No pertinent surgical history.   Current Outpatient Medications  Medication Sig Dispense Refill  . aspirin 81 MG tablet Take 81 mg by mouth daily.    . Aspirin-Acetaminophen-Caffeine (GOODY HEADACHE PO) Take 1 each by mouth.    Marland Kitchen. atorvastatin (LIPITOR) 10 MG tablet Take 10 mg by mouth daily at 6 PM.    . metFORMIN (GLUCOPHAGE) 500 MG tablet Take 500 mg by mouth 2 (two) times daily with a meal.    . metoprolol tartrate (LOPRESSOR) 25 MG tablet Take 25 mg by mouth 2 (two) times daily.    . Omega-3 Fatty Acids (FISH OIL) 1000 MG CAPS Take 1,000 mg  by mouth 3 (three) times daily.    Marland Kitchen. lisinopril-hydrochlorothiazide (PRINZIDE,ZESTORETIC) 20-25 MG tablet Take 1 tablet by mouth daily.     No current facility-administered medications for this visit.     Allergies:   Patient has no known allergies.    Social History:  The patient  reports that he has never smoked. He has never used smokeless tobacco. He reports that he does not drink alcohol or use drugs.   Family History:  The patient's family history includes Cancer in his brother and father; Diabetes in his father; Heart disease in his father; Hypertension in his father.    ROS:  Please see the history of present illness.   Otherwise, review of systems are positive for none.   All other systems are reviewed and negative.    PHYSICAL EXAM: VS:  BP (!) 172/99   Pulse (!) 104   Ht 5\' 9"  (1.753 m)   Wt 257 lb 9.6 oz (116.8 kg)   BMI 38.04 kg/m  , BMI Body mass index is 38.04 kg/m. GEN: Well nourished, obese BM, in no acute distress  HEENT: normal  Neck: no JVD, carotid bruits, or masses Cardiac: RRR with occ extrasystole; no murmurs, rubs, or gallops,no edema  Respiratory:  clear to auscultation bilaterally, normal work of breathing GI: soft, nontender, nondistended, + BS MS: no  deformity or atrophy  Skin: warm and dry, no rash Neuro:  Strength and sensation are intact Psych: euthymic mood, full affect   EKG:  EKG is ordered today. The ekg ordered today demonstrates NSR with occ to frequent PVCs. Diffuse nonspecific ST-T wave abnormality. I have personally reviewed and interpreted this study.    Recent Labs: No results found for requested labs within last 8760 hours.    Lipid Panel No results found for: CHOL, TRIG, HDL, CHOLHDL, VLDL, LDLCALC, LDLDIRECT    Wt Readings from Last 3 Encounters:  07/10/18 257 lb 9.6 oz (116.8 kg)      Other studies Reviewed: Additional studies/ records that were reviewed today include:Labs dated 07/03/18: cholesterol 224,  triglycerides 206, HDL 41, LDL 138. Glucose 306, A1c 8.7%       ASSESSMENT AND PLAN:  1.  Paroxysmal atrial fibrillation with RVR. Patient has converted back to NSR. First known episode. Risk factors of DM and HTN. Will need to check with primary care to see if chemistries, CBC and TSH done. Will arrange for Echo. Recommend he continue to take metoprolol as ordered at bid. Will follow up after above testing. Will need to consider whether we should anticoagulate long term with a [place] vasc score of 2 for first episode of Afib or continue to monitor. 2. PVCs. Avoid caffeine. Continue metoprolol as above 3. Atypical chest pain. Patient with multiple cardiac risk factors. He has abnormal ST changes on Ecg, PVCs, and P Afib. Recommend stress myoview to assess ischemic risk 4. HTN poorly controlled. Continue metoprolol. Resume lisinopril HCT. 5. DM per primary care.  6. Hyperlipidemia. Recently started on statin. Goal LDL <70.    Current medicines are reviewed at length with the patient today.  The patient does not have concerns regarding medicines.  The following changes have been made:  See above  Labs/ tests ordered today include:   Orders Placed This Encounter  Procedures  . Myocardial Perfusion Imaging  . EKG 12-Lead  . ECHOCARDIOGRAM COMPLETE     Disposition:   FU with me in 4 weeks after above studies done.   Signed, Edgerrin Correia [place], MD  07/10/2018 5:24 PM    [facility] [address] [place], [place], [address] Phone [phone], Fax [phone]

## 2018-07-10 ENCOUNTER — Ambulatory Visit (INDEPENDENT_AMBULATORY_CARE_PROVIDER_SITE_OTHER): Payer: Self-pay | Admitting: Cardiology

## 2018-07-10 ENCOUNTER — Encounter: Payer: Self-pay | Admitting: Cardiology

## 2018-07-10 VITALS — BP 172/99 | HR 104 | Ht 69.0 in | Wt 257.6 lb

## 2018-07-10 DIAGNOSIS — E7800 Pure hypercholesterolemia, unspecified: Secondary | ICD-10-CM

## 2018-07-10 DIAGNOSIS — I480 Paroxysmal atrial fibrillation: Secondary | ICD-10-CM

## 2018-07-10 DIAGNOSIS — I493 Ventricular premature depolarization: Secondary | ICD-10-CM

## 2018-07-10 DIAGNOSIS — E119 Type 2 diabetes mellitus without complications: Secondary | ICD-10-CM

## 2018-07-10 DIAGNOSIS — R079 Chest pain, unspecified: Secondary | ICD-10-CM

## 2018-07-10 DIAGNOSIS — I10 Essential (primary) hypertension: Secondary | ICD-10-CM

## 2018-07-10 MED ORDER — LISINOPRIL-HYDROCHLOROTHIAZIDE 20-25 MG PO TABS: 1.0000 | ORAL_TABLET | Freq: Every day | ORAL | Status: DC

## 2018-07-10 NOTE — Patient Instructions (Signed)
Resume lisinopril HCT  Continue your other medication  We will schedule you for an Echocardiogram and a nuclear stress test

## 2018-07-11 ENCOUNTER — Telehealth: Payer: Self-pay | Admitting: Cardiology

## 2018-07-11 ENCOUNTER — Telehealth: Payer: Self-pay

## 2018-07-11 NOTE — Telephone Encounter (Signed)
New Message: ° ° ° °Patient is returning a call °

## 2018-07-11 NOTE — Telephone Encounter (Signed)
Left message on personal voice mail to call back Dr.Jordan advised needs more lab done  (cmet,cbc,tsh ).

## 2018-07-12 ENCOUNTER — Other Ambulatory Visit: Payer: Self-pay

## 2018-07-12 ENCOUNTER — Telehealth: Payer: Self-pay | Admitting: Cardiology

## 2018-07-12 DIAGNOSIS — I10 Essential (primary) hypertension: Secondary | ICD-10-CM

## 2018-07-12 DIAGNOSIS — I480 Paroxysmal atrial fibrillation: Secondary | ICD-10-CM

## 2018-07-12 DIAGNOSIS — R0789 Other chest pain: Secondary | ICD-10-CM

## 2018-07-12 DIAGNOSIS — R079 Chest pain, unspecified: Secondary | ICD-10-CM

## 2018-07-12 NOTE — Telephone Encounter (Signed)
07-12-18 [facility]d [facility], [phone], Per recording [employer] policy was 06-23-10 and termed 07-23-11.  Lm on pt home# [phone] (Cell#[phone] no longer in service.  Need pt's current insurance or if pt is self pay.

## 2018-07-12 NOTE — Telephone Encounter (Signed)
Spoke to patient 07/11/18 Dr.Jordan advised he needs more labs cmet,cbc,tsh.Patient stated he will come to office Mon 07/15/18 to have done.Lab orders placed in lab box.

## 2018-07-15 ENCOUNTER — Encounter (HOSPITAL_COMMUNITY): Payer: Self-pay | Admitting: Emergency Medicine

## 2018-07-15 ENCOUNTER — Ambulatory Visit (HOSPITAL_COMMUNITY)
Admission: EM | Admit: 2018-07-15 | Discharge: 2018-07-15 | Disposition: A | Discharge status: Home / Self Care | Payer: Self-pay | Attending: Urgent Care | Admitting: Urgent Care

## 2018-07-15 DIAGNOSIS — R030 Elevated blood-pressure reading, without diagnosis of hypertension: Secondary | ICD-10-CM

## 2018-07-15 DIAGNOSIS — I10 Essential (primary) hypertension: Secondary | ICD-10-CM

## 2018-07-15 DIAGNOSIS — R05 Cough: Secondary | ICD-10-CM

## 2018-07-15 DIAGNOSIS — J22 Unspecified acute lower respiratory infection: Secondary | ICD-10-CM

## 2018-07-15 DIAGNOSIS — E119 Type 2 diabetes mellitus without complications: Secondary | ICD-10-CM

## 2018-07-15 DIAGNOSIS — R059 Cough, unspecified: Secondary | ICD-10-CM

## 2018-07-15 MED ORDER — BENZONATATE 100 MG PO CAPS: 100.0000 mg | ORAL_CAPSULE | Freq: Three times a day (TID) | ORAL | 0 refills | Status: DC | PRN

## 2018-07-15 MED ORDER — PSEUDOEPHEDRINE HCL 60 MG PO TABS: 60.0000 mg | ORAL_TABLET | Freq: Three times a day (TID) | ORAL | 0 refills | Status: DC | PRN

## 2018-07-15 MED ORDER — AZITHROMYCIN 250 MG PO TABS: ORAL_TABLET | ORAL | 0 refills | Status: DC

## 2018-07-15 MED ORDER — AMLODIPINE BESYLATE 5 MG PO TABS: 5.0000 mg | ORAL_TABLET | Freq: Every day | ORAL | 0 refills | Status: DC

## 2018-07-15 NOTE — ED Provider Notes (Signed)
MRN: [id number] DOB: 06/22/1976  Subjective:   Daniel Ewing is a 42 y.o. male presenting for 1 week history of productive cough, nasal congestion, chest congestion, mild wheezing, malaise, cold sweats.  Also reports that he has had intermittent left-sided neck discomfort.  Has been trying over-the-counter medications.  Denies chest pain, belly pain, nausea, vomiting.  Reports that his diabetes is controlled that an A1c of about 8%.  Admits noncompliance with his diet regarding his blood pressure.  He does have a PCP that he is working with to try and manage his chronic medical conditions.  Denies smoking cigarettes.  He does have a heart doctor, Dr. Theron AristaPeter and is being screened for heart disease.  No current facility-administered medications for this encounter.   Current Outpatient Medications:  .  aspirin 81 MG tablet, Take 81 mg by mouth daily., Disp: , Rfl:  .  Aspirin-Acetaminophen-Caffeine (GOODY HEADACHE PO), Take 1 each by mouth., Disp: , Rfl:  .  atorvastatin (LIPITOR) 10 MG tablet, Take 10 mg by mouth daily at 6 PM., Disp: , Rfl:  .  lisinopril-hydrochlorothiazide (PRINZIDE,ZESTORETIC) 20-25 MG tablet, Take 1 tablet by mouth daily., Disp: , Rfl:  .  metFORMIN (GLUCOPHAGE) 500 MG tablet, Take 500 mg by mouth 2 (two) times daily with a meal., Disp: , Rfl:  .  metoprolol tartrate (LOPRESSOR) 25 MG tablet, Take 25 mg by mouth 2 (two) times daily., Disp: , Rfl:  .  Omega-3 Fatty Acids (FISH OIL) 1000 MG CAPS, Take 1,000 mg by mouth 3 (three) times daily., Disp: , Rfl:    No Known Allergies  Past Medical History:  Diagnosis Date  . Diabetes mellitus without complication (HCC)   . Hyperlipidemia   . Hypertension      Denies past surgical history.    Objective:   Vitals: BP (!) 158/104   Pulse 96   Temp 98.3 F (36.8 C)   Resp 16   SpO2 99%   BP Readings from Last 3 Encounters:  07/15/18 (!) 158/104  07/10/18 (!) 172/99  12/03/15 (!) 143/104    Physical Exam   Constitutional: He is oriented to person, place, and time. He appears well-developed and well-nourished.  HENT:  Right Ear: Tympanic membrane normal.  Left Ear: Tympanic membrane normal.  Nose: No sinus tenderness.  Mouth/Throat: Oropharynx is clear and moist.  Eyes: Right eye exhibits no discharge. Left eye exhibits no discharge. No scleral icterus.  Neck: Normal range of motion. Neck supple.  Cardiovascular: Normal rate, regular rhythm and intact distal pulses. Exam reveals no gallop and no friction rub.  No murmur heard. Pulmonary/Chest: Effort normal. No stridor. No respiratory distress. He has no wheezes. He has no rales.  Decreased albeit clear lung sounds throughout.  Lymphadenopathy:    He has no cervical adenopathy.  Neurological: He is alert and oriented to person, place, and time.  Skin: Skin is warm and dry.   Assessment and Plan :   Lower respiratory infection  Cough  Essential hypertension  Elevated blood pressure reading  Controlled type 2 diabetes mellitus without complication, without long-term current use of insulin (HCC)  Will use azithromycin to address her lower respiratory infection.  Use Tessalon capsules for cough suppression.  Recommended supportive care otherwise.  Regarding his uncontrolled blood pressure, patient was agreeable to start amlodipine at 5 mg.  Counseled on general management of his chronic medical conditions including his diabetes and blood pressure.  For his neck pain, recommend the patient hydrate adequately and use Tylenol  and ibuprofen. Counseled patient on potential for adverse effects with medications prescribed today, patient verbalized understanding. Return-to-clinic precautions discussed, patient verbalized understanding.  Otherwise maintain follow-up with his PCP for management of his chronic medical conditions.     Wallis Bamberg, [place] 07/15/18 1859

## 2018-07-15 NOTE — ED Triage Notes (Signed)
Pt c/o cold symptoms, cough, congestion, neck pain, sweats x1 week.

## 2018-07-15 NOTE — Discharge Instructions (Addendum)
Hydrate well with at least 2 liters of water daily. For sore throat try using a honey-based tea. Use 3 teaspoons of honey with juice squeezed from half lemon. Place shaved pieces of ginger into 1/2-1 cup of water and warm over stove top. Then mix the ingredients and repeat every 4 hours as needed. You may take 500mg Tylenol with ibuprofen 400-600mg every 6 hours for pain and inflammation. °

## 2018-07-16 ENCOUNTER — Ambulatory Visit (HOSPITAL_COMMUNITY): Payer: Self-pay | Attending: Cardiology

## 2018-07-16 ENCOUNTER — Telehealth (HOSPITAL_COMMUNITY): Payer: Self-pay

## 2018-07-16 ENCOUNTER — Other Ambulatory Visit: Payer: Self-pay

## 2018-07-16 DIAGNOSIS — I493 Ventricular premature depolarization: Secondary | ICD-10-CM | POA: Insufficient documentation

## 2018-07-16 DIAGNOSIS — R079 Chest pain, unspecified: Secondary | ICD-10-CM

## 2018-07-16 DIAGNOSIS — I480 Paroxysmal atrial fibrillation: Secondary | ICD-10-CM | POA: Insufficient documentation

## 2018-07-16 DIAGNOSIS — E119 Type 2 diabetes mellitus without complications: Secondary | ICD-10-CM | POA: Insufficient documentation

## 2018-07-16 DIAGNOSIS — E7800 Pure hypercholesterolemia, unspecified: Secondary | ICD-10-CM | POA: Insufficient documentation

## 2018-07-16 DIAGNOSIS — I081 Rheumatic disorders of both mitral and tricuspid valves: Secondary | ICD-10-CM | POA: Insufficient documentation

## 2018-07-16 DIAGNOSIS — I509 Heart failure, unspecified: Secondary | ICD-10-CM | POA: Insufficient documentation

## 2018-07-16 DIAGNOSIS — I110 Hypertensive heart disease with heart failure: Secondary | ICD-10-CM | POA: Insufficient documentation

## 2018-07-16 DIAGNOSIS — I10 Essential (primary) hypertension: Secondary | ICD-10-CM

## 2018-07-16 LAB — CBC WITH DIFFERENTIAL/PLATELET
BASOS ABS: 0.1 10*3/uL (ref 0.0–0.2)
Basos: 1 %
EOS (ABSOLUTE): 0.1 10*3/uL (ref 0.0–0.4)
Eos: 1 %
HEMOGLOBIN: 15.4 g/dL (ref 13.0–17.7)
Hematocrit: 45.4 % (ref 37.5–51.0)
Immature Grans (Abs): 0 10*3/uL (ref 0.0–0.1)
Immature Granulocytes: 0 %
Lymphocytes Absolute: 2.8 10*3/uL (ref 0.7–3.1)
Lymphs: 29 %
MCH: 28.1 pg (ref 26.6–33.0)
MCHC: 33.9 g/dL (ref 31.5–35.7)
MCV: 83 fL (ref 79–97)
Monocytes Absolute: 0.5 10*3/uL (ref 0.1–0.9)
Monocytes: 6 %
Neutrophils Absolute: 6.2 10*3/uL (ref 1.4–7.0)
Neutrophils: 63 %
PLATELETS: 248 10*3/uL (ref 150–450)
RBC: 5.48 x10E6/uL (ref 4.14–5.80)
RDW: 13.7 % (ref 12.3–15.4)
WBC: 9.7 10*3/uL (ref 3.4–10.8)

## 2018-07-16 LAB — COMPREHENSIVE METABOLIC PANEL
ALBUMIN: 4.7 g/dL (ref 3.5–5.5)
ALK PHOS: 61 IU/L (ref 39–117)
ALT: 34 IU/L (ref 0–44)
AST: 28 IU/L (ref 0–40)
Albumin/Globulin Ratio: 2 (ref 1.2–2.2)
BILIRUBIN TOTAL: 0.5 mg/dL (ref 0.0–1.2)
BUN / CREAT RATIO: 11 (ref 9–20)
BUN: 15 mg/dL (ref 6–24)
CALCIUM: 9.7 mg/dL (ref 8.7–10.2)
CHLORIDE: 97 mmol/L (ref 96–106)
CO2: 24 mmol/L (ref 20–29)
Creatinine, Ser: 1.33 mg/dL — ABNORMAL HIGH (ref 0.76–1.27)
GFR calc Af Amer: 77 mL/min/{1.73_m2} (ref >59)
GFR calc non Af Amer: 66 mL/min/{1.73_m2} (ref >59)
Globulin, Total: 2.3 g/dL (ref 1.5–4.5)
Glucose: 224 mg/dL — ABNORMAL HIGH (ref 65–99)
Potassium: 4 mmol/L (ref 3.5–5.2)
Sodium: 140 mmol/L (ref 134–144)
Total Protein: 7 g/dL (ref 6.0–8.5)

## 2018-07-16 LAB — TSH: TSH: 0.992 u[IU]/mL (ref 0.450–4.500)

## 2018-07-16 NOTE — Telephone Encounter (Signed)
Encounter complete. 

## 2018-07-17 ENCOUNTER — Telehealth (HOSPITAL_COMMUNITY): Payer: Self-pay

## 2018-07-17 NOTE — Telephone Encounter (Signed)
Encounter complete. 

## 2018-07-18 ENCOUNTER — Ambulatory Visit (HOSPITAL_COMMUNITY)
Admission: RE | Admit: 2018-07-18 | Discharge: 2018-07-18 | Disposition: A | Discharge status: Home / Self Care | Payer: Self-pay | Source: Ambulatory Visit | Attending: Cardiology | Admitting: Cardiology

## 2018-07-18 ENCOUNTER — Other Ambulatory Visit: Payer: Self-pay

## 2018-07-18 DIAGNOSIS — R0789 Other chest pain: Secondary | ICD-10-CM | POA: Insufficient documentation

## 2018-07-18 DIAGNOSIS — I480 Paroxysmal atrial fibrillation: Secondary | ICD-10-CM | POA: Insufficient documentation

## 2018-07-18 LAB — MYOCARDIAL PERFUSION IMAGING
CHL CUP MPHR: 180 {beats}/min
CHL CUP NUCLEAR SDS: 2
CSEPED: 11 min
CSEPHR: 91 %
Estimated workload: 13.4 METS
Exercise duration (sec): 50 s
LV sys vol: 106 mL
LVDIAVOL: 159 mL (ref 62–150)
NUC STRESS TID: 1.11
Peak HR: 164 {beats}/min
RPE: 17
Rest HR: 80 {beats}/min
SRS: 0
SSS: 2

## 2018-07-18 MED ORDER — TECHNETIUM TC 99M TETROFOSMIN IV KIT
10.5000 | PACK | Freq: Once | INTRAVENOUS | Status: AC | PRN
  Administered 2018-07-18: 10.5 via INTRAVENOUS
  Filled 2018-07-18: qty 11

## 2018-07-18 MED ORDER — LISINOPRIL 20 MG PO TABS: 20.0000 mg | ORAL_TABLET | Freq: Every day | ORAL | 6 refills | Status: DC

## 2018-07-18 MED ORDER — TECHNETIUM TC 99M TETROFOSMIN IV KIT
32.3000 | PACK | Freq: Once | INTRAVENOUS | Status: AC | PRN
  Administered 2018-07-18: 32.3 via INTRAVENOUS
  Filled 2018-07-18: qty 33

## 2018-08-09 NOTE — Progress Notes (Signed)
Cardiology Office Note   Date:  08/12/2018   ID:  Daniel Ewing, DOB 06/16/1976, MRN [id number]  PCP:  Diamantina Providence, FNP  Cardiologist:   Tiny Chaudhary [place], MD   Chief Complaint  Patient presents with  . Hyperlipidemia  . Hypertension  . Atrial Fibrillation      History of Present Illness: Daniel Ewing is a 42 y.o. male who is seen for follow up of  atrial fibrillation, and chest pain. He has a history of DM, HLD, and HTN. He was seen in September with complaints of a sharp sudden pain in his chest while bending over. He also notes an irregular heart beat. He reports DM diagnosed in 2011. He has HTN which was controlled with lisinopril HCT. On statin for cholesterol. When seen at primary care office Ecg showed atrial fibrillation with rate 145 bpm. ST-T changes c/w inferior ischemia. He was started on metoprolol. Reports lisinopril HCT was stopped. Reports chest pain resolved. No dyspnea or dizziness. On repeat Ecg in our office he was back in NSR with frequent PVCs.  Prior to this was doing a cardio work out 5x/week. No history of murmur, bleeding, TIA, or CVA symptoms. Avoids caffeine. No Etoh or tobacco use. 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   His evaluation included an Echo showing low normal LV function with EF 50-55%. Mild MR, mild LAE. Myoview showed normal perfusion with EF 34%. This was felt to be falsely low due to gating difficulty with ectopy. TSH was normal.    Past Medical History:  Diagnosis Date  . Diabetes mellitus without complication (HCC)   . Hyperlipidemia   . Hypertension     History reviewed. No pertinent surgical history.   Current Outpatient Medications  Medication Sig Dispense Refill  . amLODipine (NORVASC) 5 MG tablet Take 1 tablet (5 mg total) by mouth daily. 30 tablet 0  . atorvastatin (LIPITOR) 10 MG tablet Take 10 mg by mouth daily at 6 PM.    . metFORMIN (GLUCOPHAGE) 500 MG tablet Take 500 mg by mouth 2 (two) times daily with a  meal.    . Omega-3 Fatty Acids (FISH OIL) 1000 MG CAPS Take 1,000 mg by mouth 3 (three) times daily.    Marland Kitchen lisinopril (PRINIVIL,ZESTRIL) 40 MG tablet Take 1 tablet (40 mg total) by mouth daily. 90 tablet 3  . metoprolol tartrate (LOPRESSOR) 50 MG tablet Take 1 tablet (50 mg total) by mouth 2 (two) times daily. 180 tablet 3   No current facility-administered medications for this visit.     Allergies:   Patient has no known allergies.    Social History:  The patient  reports that he has never smoked. He has never used smokeless tobacco. He reports that he does not drink alcohol or use drugs.   Family History:  The patient's family history includes Cancer in his brother and father; Diabetes in his father; Heart disease in his father; Hypertension in his father.    ROS:  Please see the history of present illness.   Otherwise, review of systems are positive for none.   All other systems are reviewed and negative.    PHYSICAL EXAM: VS:  BP (!) 130/102   Pulse 84   Ht 6' (1.829 m)   Wt 248 lb 9.6 oz (112.8 kg)   BMI 33.72 kg/m  , BMI Body mass index is 33.72 kg/m. GENERAL:  Well appearing BM in NAD HEENT:  PERRL, EOMI, sclera are clear. Oropharynx is  clear. NECK:  No jugular venous distention, carotid upstroke brisk and symmetric, no bruits, no thyromegaly or adenopathy LUNGS:  Clear to auscultation bilaterally CHEST:  Unremarkable HEART:  RRR,  PMI not displaced or sustained,S1 and S2 within normal limits, no S3, no S4: no clicks, no rubs, no murmurs ABD:  Soft, nontender. BS +, no masses or bruits. No hepatomegaly, no splenomegaly EXT:  2 + pulses throughout, no edema, no cyanosis no clubbing SKIN:  Warm and dry.  No rashes NEURO:  Alert and oriented x 3. Cranial nerves II through XII intact. PSYCH:  Cognitively intact     EKG:  EKG is not ordered today.   Recent Labs: 07/15/2018: ALT 34; BUN 15; Creatinine, Ser 1.33; Hemoglobin 15.4; Platelets 248; Potassium 4.0; Sodium 140;  TSH 0.992    Lipid Panel No results found for: CHOL, TRIG, HDL, CHOLHDL, VLDL, LDLCALC, LDLDIRECT    Wt Readings from Last 3 Encounters:  08/12/18 248 lb 9.6 oz (112.8 kg)  07/18/18 258 lb (117 kg)  07/10/18 257 lb 9.6 oz (116.8 kg)      Other studies Reviewed: Additional studies/ records that were reviewed today include:Labs dated 07/03/18: cholesterol 224, triglycerides 206, HDL 41, LDL 138. Glucose 306, A1c 8.7%    Echo 07/16/18:Study Conclusions  - Left ventricle: The cavity size was normal. There was mild   concentric hypertrophy. Systolic function was normal. The   estimated ejection fraction was in the range of 50% to 55%. Wall   motion was normal; there were no regional wall motion   abnormalities. Doppler parameters are consistent with abnormal   left ventricular relaxation (grade 1 diastolic dysfunction).   There was no evidence of elevated ventricular filling pressure by   Doppler parameters. - Aortic valve: Trileaflet; normal thickness leaflets. There was no   regurgitation. - Mitral valve: There was mild regurgitation. - Right ventricle: The cavity size was normal. Wall thickness was   normal. Systolic function was normal. - Right atrium: The atrium was normal in size. - Tricuspid valve: There was mild regurgitation. - Pulmonary arteries: Systolic pressure was within the normal   range. - Inferior vena cava: The vessel was normal in size. The   respirophasic diameter changes were in the normal range (= 50%),   consistent with normal central venous pressure. - Pericardium, extracardiac: There was no pericardial effusion.  Impressions:  - Low normal LVEF 50-55%, mild concentric LVH, grade 1 diastolic   dysfunction.  Myoview 07/18/18:  Study Highlights     The left ventricular ejection fraction is moderately decreased (30-44%).  Nuclear stress EF: 34%.  There was no ST segment deviation noted during stress.  This is an intermediate risk study.     Normal perfusion. No infarct or ischemia. Diffuse hypokinesis with estimated EF 34% Suggest TTE/MRI correlation Baseline ECG with LVH and strain       ASSESSMENT AND PLAN:  1.  Paroxysmal atrial fibrillation with RVR. Patient has converted back to NSR.  Risk factors of DM and HTN. He has a [place] Vasc score of 3 based on HTN, DM and LV dysfunction. Recommend anticoagulation. Ideally would like to have him on a DOAC. Currently he has no insurance but is enrolling now and hopefully will have insurance in the new year. We will start him on Xarelto 20 mg daily and samples given. Instructed to avoid ASA and NSAIDs. Take Tylenol for pain.  2. PVCs. Avoid caffeine. Continue metoprolol  3. Chronic systolic CHF. I personally reviewed Echo and  Myoview studies. By echo there is global HK with EF approximately 45% by my evaluation. I think the reported EF on Myoview is too low. Will optimize CHF therapy with increase in metoprolol and lisinopril doses.  4. HTN improved but still not optimally controlled. Will increase metoprolol to 50 mg bid and lisinopril to 40 mg daily 5. DM per primary care.  6. Hyperlipidemia. Recently started on statin. Goal LDL <70.    Current medicines are reviewed at length with the patient today.  The patient does not have concerns regarding medicines.  The following changes have been made:  See above  Labs/ tests ordered today include:   No orders of the defined types were placed in this encounter.    Disposition:   FU with me in 2 months.   Signed, Addley Ballinger [place], MD  08/12/2018 10:50 AM    [facility] [address], [place], [place], [address] Phone [phone], Fax [phone]

## 2018-08-12 ENCOUNTER — Ambulatory Visit (INDEPENDENT_AMBULATORY_CARE_PROVIDER_SITE_OTHER): Payer: Self-pay | Admitting: Cardiology

## 2018-08-12 ENCOUNTER — Encounter: Payer: Self-pay | Admitting: Cardiology

## 2018-08-12 VITALS — BP 130/102 | HR 84 | Ht 72.0 in | Wt 248.6 lb

## 2018-08-12 DIAGNOSIS — E119 Type 2 diabetes mellitus without complications: Secondary | ICD-10-CM

## 2018-08-12 DIAGNOSIS — I480 Paroxysmal atrial fibrillation: Secondary | ICD-10-CM

## 2018-08-12 DIAGNOSIS — I5022 Chronic systolic (congestive) heart failure: Secondary | ICD-10-CM

## 2018-08-12 DIAGNOSIS — I10 Essential (primary) hypertension: Secondary | ICD-10-CM

## 2018-08-12 DIAGNOSIS — E7800 Pure hypercholesterolemia, unspecified: Secondary | ICD-10-CM

## 2018-08-12 MED ORDER — METOPROLOL TARTRATE 50 MG PO TABS: 50.0000 mg | ORAL_TABLET | Freq: Two times a day (BID) | ORAL | 3 refills | Status: DC

## 2018-08-12 MED ORDER — LISINOPRIL 40 MG PO TABS: 40.0000 mg | ORAL_TABLET | Freq: Every day | ORAL | 3 refills | Status: AC

## 2018-08-12 NOTE — Patient Instructions (Signed)
Increase lisinopril to 40 mg daily  Increase metoprolol to 50 mg bid  We will see about starting you on a blood thinner

## 2018-09-09 ENCOUNTER — Ambulatory Visit: Payer: PRIVATE HEALTH INSURANCE | Admitting: Cardiology

## 2018-10-04 NOTE — Progress Notes (Signed)
Cardiology Office Note   Date:  10/08/2018   ID:  Daniel Ewing, DOB 01/08/1976, MRN [id number]  PCP:  Diamantina ProvidenceAnderson, Takela N, FNP  Cardiologist:   Loraine Freid [place], MD   Chief Complaint  Patient presents with  . Follow-up      History of Present Illness: Daniel Haroldnwar F Lumley is a 42 y.o. male who is seen for follow up of  atrial fibrillation, and chest pain. He has a history of DM, HLD, and HTN. He was seen in September with complaints of a sharp sudden pain in his chest while bending over and irregular heart beat.  When seen at primary care office Ecg showed atrial fibrillation with rate 145 bpm. ST-T changes c/w inferior ischemia. He was started on metoprolol. later started on lisinopril.  On repeat Ecg in our office he was back in NSR with frequent PVCs.  Prior to this was doing a cardio work out 5x/week. No history of murmur, bleeding, TIA, or CVA symptoms. Avoids caffeine. No Etoh or tobacco use. 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   His evaluation included an Echo showing low normal LV function with EF 45% by my review. Mild MR, mild LAE. Myoview showed normal perfusion with EF 34%. This was felt to be falsely low due to gating difficulty with ectopy. TSH was normal.   On follow up today he feels fine. No chest pain, SOB, edema, orthopnea or PND. Tolerating Xarelto well. Tolerating other medication fine.    Past Medical History:  Diagnosis Date  . Diabetes mellitus without complication (HCC)   . Hyperlipidemia   . Hypertension     History reviewed. No pertinent surgical history.   Current Outpatient Medications  Medication Sig Dispense Refill  . amLODipine (NORVASC) 5 MG tablet Take 1 tablet (5 mg total) by mouth daily. 30 tablet 0  . atorvastatin (LIPITOR) 10 MG tablet Take 10 mg by mouth daily at 6 PM.    . lisinopril (PRINIVIL,ZESTRIL) 40 MG tablet Take 1 tablet (40 mg total) by mouth daily. 90 tablet 3  . metFORMIN (GLUCOPHAGE) 500 MG tablet Take 500 mg by mouth 2 (two)  times daily with a meal.    . metoprolol tartrate (LOPRESSOR) 50 MG tablet Take 1 tablet (50 mg total) by mouth 2 (two) times daily. 180 tablet 3  . Omega-3 Fatty Acids (FISH OIL) 1000 MG CAPS Take 1,000 mg by mouth 3 (three) times daily.     No current facility-administered medications for this visit.     Allergies:   Patient has no known allergies.    Social History:  The patient  reports that he has never smoked. He has never used smokeless tobacco. He reports that he does not drink alcohol or use drugs.   Family History:  The patient's family history includes Cancer in his brother and father; Diabetes in his father; Heart disease in his father; Hypertension in his father.    ROS:  Please see the history of present illness.   Otherwise, review of systems are positive for none.   All other systems are reviewed and negative.    PHYSICAL EXAM: VS:  BP 132/87   Pulse 70   Ht 5\' 9"  (1.753 m)   Wt 248 lb 12.8 oz (112.9 kg)   BMI 36.74 kg/m  , BMI Body mass index is 36.74 kg/m. GENERAL:  Well appearing BM in NAD HEENT:  PERRL, EOMI, sclera are clear. Oropharynx is clear. NECK:  No jugular venous distention, carotid upstroke  brisk and symmetric, no bruits, no thyromegaly or adenopathy LUNGS:  Clear to auscultation bilaterally CHEST:  Unremarkable HEART:  RRR,  PMI not displaced or sustained,S1 and S2 within normal limits, no S3, no S4: no clicks, no rubs, no murmurs ABD:  Soft, nontender. BS +, no masses or bruits. No hepatomegaly, no splenomegaly EXT:  2 + pulses throughout, no edema, no cyanosis no clubbing SKIN:  Warm and dry.  No rashes NEURO:  Alert and oriented x 3. Cranial nerves II through XII intact. PSYCH:  Cognitively intact  EKG:  EKG is not ordered today.   Recent Labs: 07/15/2018: ALT 34; BUN 15; Creatinine, Ser 1.33; Hemoglobin 15.4; Platelets 248; Potassium 4.0; Sodium 140; TSH 0.992    Lipid Panel No results found for: CHOL, TRIG, HDL, CHOLHDL, VLDL, LDLCALC,  LDLDIRECT    Wt Readings from Last 3 Encounters:  10/08/18 248 lb 12.8 oz (112.9 kg)  08/12/18 248 lb 9.6 oz (112.8 kg)  07/18/18 258 lb (117 kg)      Other studies Reviewed: Additional studies/ records that were reviewed today include:Labs dated 07/03/18: cholesterol 224, triglycerides 206, HDL 41, LDL 138. Glucose 306, A1c 8.7%    Echo 07/16/18:Study Conclusions  - Left ventricle: The cavity size was normal. There was mild   concentric hypertrophy. Systolic function was normal. The   estimated ejection fraction was in the range of 50% to 55%. Wall   motion was normal; there were no regional wall motion   abnormalities. Doppler parameters are consistent with abnormal   left ventricular relaxation (grade 1 diastolic dysfunction).   There was no evidence of elevated ventricular filling pressure by   Doppler parameters. - Aortic valve: Trileaflet; normal thickness leaflets. There was no   regurgitation. - Mitral valve: There was mild regurgitation. - Right ventricle: The cavity size was normal. Wall thickness was   normal. Systolic function was normal. - Right atrium: The atrium was normal in size. - Tricuspid valve: There was mild regurgitation. - Pulmonary arteries: Systolic pressure was within the normal   range. - Inferior vena cava: The vessel was normal in size. The   respirophasic diameter changes were in the normal range (= 50%),   consistent with normal central venous pressure. - Pericardium, extracardiac: There was no pericardial effusion.  Impressions:  - Low normal LVEF 50-55%, mild concentric LVH, grade 1 diastolic   dysfunction.  Myoview 07/18/18:  Study Highlights     The left ventricular ejection fraction is moderately decreased (30-44%).  Nuclear stress EF: 34%.  There was no ST segment deviation noted during stress.  This is an intermediate risk study.   Normal perfusion. No infarct or ischemia. Diffuse hypokinesis with estimated EF 34% Suggest  TTE/MRI correlation Baseline ECG with LVH and strain       ASSESSMENT AND PLAN:  1.  Paroxysmal atrial fibrillation with RVR. Patient has been maintaining NSR.  Risk factors of DM and HTN. He has a [place] Vasc score of 3 based on HTN, DM and LV dysfunction. Recommend anticoagulation. Long term with Xarelto 20 mg daily.   Instructed to avoid ASA and NSAIDs. 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 2. PVCs. Avoid caffeine. Continue metoprolol  3. Chronic systolic CHF. By echo there is global HK with EF approximately 45% by my evaluation. I think the reported EF on Myoview is too low. Now on good therapy with lisinopril and metoprolol. Will repeat Echo in 3 months.  4. HTN well controlled.  5. DM  per primary care.  6. Hyperlipidemia.  started on statin. Goal LDL <70.    Current medicines are reviewed at length with the patient today.  The patient does not have concerns regarding medicines.  The following changes have been made:  See above  Labs/ tests ordered today include:   No orders of the defined types were placed in this encounter.    Disposition:   FU with me in 4 months after Echo.   Signed, Kwadwo Taras [place], MD  10/08/2018 1:47 PM    [facility] [address], [place], [place], [address] Phone [phone], Fax [phone]

## 2018-10-08 ENCOUNTER — Encounter (INDEPENDENT_AMBULATORY_CARE_PROVIDER_SITE_OTHER): Payer: Self-pay

## 2018-10-08 ENCOUNTER — Encounter: Payer: Self-pay | Admitting: Cardiology

## 2018-10-08 ENCOUNTER — Ambulatory Visit (INDEPENDENT_AMBULATORY_CARE_PROVIDER_SITE_OTHER): Payer: Self-pay | Admitting: Cardiology

## 2018-10-08 VITALS — BP 132/87 | HR 70 | Ht 69.0 in | Wt 248.8 lb

## 2018-10-08 DIAGNOSIS — E7800 Pure hypercholesterolemia, unspecified: Secondary | ICD-10-CM

## 2018-10-08 DIAGNOSIS — I10 Essential (primary) hypertension: Secondary | ICD-10-CM

## 2018-10-08 DIAGNOSIS — I5022 Chronic systolic (congestive) heart failure: Secondary | ICD-10-CM

## 2018-10-08 DIAGNOSIS — I480 Paroxysmal atrial fibrillation: Secondary | ICD-10-CM

## 2018-10-08 MED ORDER — RIVAROXABAN 20 MG PO TABS: 20.0000 mg | ORAL_TABLET | Freq: Every day | ORAL | 6 refills | Status: DC

## 2018-10-08 NOTE — Patient Instructions (Signed)
Medication Instructions:  Start Xarelto 20 mg daily with supper in January If you need a refill on your cardiac medications before your next appointment, please call your pharmacy.   Lab work: None ordered  Testing/Procedures: Schedule Echo in April  Follow-Up: At [facility], you and your health needs are our priority.  As part of our continuing mission to provide you with exceptional heart care, we have created designated Provider Care Teams.  These Care Teams include your primary Cardiologist (physician) and Advanced Practice Providers (APPs -  Physician Assistants and Nurse Practitioners) who all work together to provide you with the care you need, when you need it. . Schedule follow Up appointment with Dr.Jordan after echo in April

## 2018-10-08 NOTE — Addendum Note (Signed)
Addended by: Neoma LamingPUGH, Michon Kaczmarek J on: 10/08/2018 02:01 PM   Modules accepted: Orders

## 2018-11-14 DIAGNOSIS — I4891 Unspecified atrial fibrillation: Secondary | ICD-10-CM | POA: Insufficient documentation

## 2018-11-14 DIAGNOSIS — E7800 Pure hypercholesterolemia, unspecified: Secondary | ICD-10-CM | POA: Insufficient documentation

## 2018-11-14 DIAGNOSIS — E119 Type 2 diabetes mellitus without complications: Secondary | ICD-10-CM | POA: Insufficient documentation

## 2019-01-24 ENCOUNTER — Telehealth: Payer: Self-pay | Admitting: Cardiology

## 2019-01-24 NOTE — Telephone Encounter (Signed)
   Left message on telephone about rescheduling echocardiogram.  This seems reasonable to postpone for another 3 months given his overall stability.  Reviewed Dr. Elvis Coil last note.  I asked him to call back our clinic office number to verify.  Donato Schultz, MD

## 2019-01-28 ENCOUNTER — Ambulatory Visit (HOSPITAL_COMMUNITY): Payer: Self-pay

## 2019-02-25 DIAGNOSIS — H2513 Age-related nuclear cataract, bilateral: Secondary | ICD-10-CM | POA: Insufficient documentation

## 2019-02-25 DIAGNOSIS — H35033 Hypertensive retinopathy, bilateral: Secondary | ICD-10-CM | POA: Insufficient documentation

## 2019-04-15 ENCOUNTER — Telehealth (HOSPITAL_COMMUNITY): Payer: Self-pay | Admitting: Radiology

## 2019-04-15 NOTE — Telephone Encounter (Signed)

## 2019-04-15 NOTE — Telephone Encounter (Signed)
Left message on voicemail to call back for screening questions.

## 2019-04-16 ENCOUNTER — Other Ambulatory Visit: Payer: Self-pay

## 2019-04-16 ENCOUNTER — Ambulatory Visit (HOSPITAL_COMMUNITY): Payer: Self-pay | Attending: Cardiovascular Disease

## 2019-04-16 ENCOUNTER — Encounter (INDEPENDENT_AMBULATORY_CARE_PROVIDER_SITE_OTHER): Payer: Self-pay

## 2019-04-16 DIAGNOSIS — E7800 Pure hypercholesterolemia, unspecified: Secondary | ICD-10-CM | POA: Insufficient documentation

## 2019-04-16 DIAGNOSIS — I480 Paroxysmal atrial fibrillation: Secondary | ICD-10-CM | POA: Insufficient documentation

## 2019-04-16 DIAGNOSIS — I5022 Chronic systolic (congestive) heart failure: Secondary | ICD-10-CM | POA: Insufficient documentation

## 2019-04-16 DIAGNOSIS — I10 Essential (primary) hypertension: Secondary | ICD-10-CM | POA: Insufficient documentation

## 2019-04-17 ENCOUNTER — Telehealth: Payer: Self-pay | Admitting: *Deleted

## 2019-04-17 NOTE — Telephone Encounter (Signed)
Left message to call back - echo result 

## 2019-04-17 NOTE — Telephone Encounter (Signed)
The patient has been notified of the result and verbalized understanding.  All questions (if any) were answered. Raiford Simmonds, RN 04/17/2019 10:32 AM

## 2019-04-17 NOTE — Telephone Encounter (Signed)
-----   Message from Peter M Martinique, MD sent at 04/17/2019  7:38 AM EDT ----- This study demonstrates:  Echo shows improvement in LV function with increased EF. Now in low normal range.  Medication changes / Follow up studies / Other recommendations:   Continue current therapy. This is good news!  Please send results to the PCP:  Vonna Drafts, FNP  Peter Martinique, MD 04/17/2019 7:36 AM

## 2019-05-22 ENCOUNTER — Other Ambulatory Visit: Payer: Self-pay | Admitting: Cardiology

## 2019-08-24 ENCOUNTER — Other Ambulatory Visit: Payer: Self-pay | Admitting: Cardiology

## 2019-10-01 ENCOUNTER — Other Ambulatory Visit: Payer: Self-pay | Admitting: Cardiology

## 2020-02-13 ENCOUNTER — Encounter: Payer: Self-pay | Admitting: Physician Assistant

## 2020-02-13 ENCOUNTER — Telehealth: Payer: Self-pay | Admitting: Physician Assistant

## 2020-02-13 ENCOUNTER — Telehealth: Payer: Self-pay

## 2020-02-13 NOTE — Telephone Encounter (Signed)
  Patient Consent for Virtual Visit         Daniel Ewing has provided verbal consent on 02/13/2020 for a virtual visit (video or telephone).   CONSENT FOR VIRTUAL VISIT FOR:  Daniel Ewing  By participating in this virtual visit I agree to the following:  I hereby voluntarily request, consent and authorize [facility] and its employed or contracted physicians, physician assistants, nurse practitioners or other licensed health care professionals (the Practitioner), to provide me with telemedicine health care services (the "Services") as deemed necessary by the treating Practitioner. I acknowledge and consent to receive the Services by the Practitioner via telemedicine. I understand that the telemedicine visit will involve communicating with the Practitioner through live audiovisual communication technology and the disclosure of certain medical information by electronic transmission. I acknowledge that I have been given the opportunity to request an in-person assessment or other available alternative prior to the telemedicine visit and am voluntarily participating in the telemedicine visit.  I understand that I have the right to withhold or withdraw my consent to the use of telemedicine in the course of my care at any time, without affecting my right to future care or treatment, and that the Practitioner or I may terminate the telemedicine visit at any time. I understand that I have the right to inspect all information obtained and/or recorded in the course of the telemedicine visit and may receive copies of available information for a reasonable fee.  I understand that some of the potential risks of receiving the Services via telemedicine include:  Marland Kitchen Delay or interruption in medical evaluation due to technological equipment failure or disruption; . Information transmitted may not be sufficient (e.g. poor resolution of images) to allow for appropriate medical decision making by the Practitioner;  and/or  . In rare instances, security protocols could fail, causing a breach of personal health information.  Furthermore, I acknowledge that it is my responsibility to provide information about my medical history, conditions and care that is complete and accurate to the best of my ability. I acknowledge that Practitioner's advice, recommendations, and/or decision may be based on factors not within their control, such as incomplete or inaccurate data provided by me or distortions of diagnostic images or specimens that may result from electronic transmissions. I understand that the practice of medicine is not an exact science and that Practitioner makes no warranties or guarantees regarding treatment outcomes. I acknowledge that a copy of this consent can be made available to me via my patient portal [facility] MyChart), or I can request a printed copy by calling the office of [facility].    I understand that my insurance will be billed for this visit.   I have read or had this consent read to me. . I understand the contents of this consent, which adequately explains the benefits and risks of the Services being provided via telemedicine.  . I have been provided ample opportunity to ask questions regarding this consent and the Services and have had my questions answered to my satisfaction. . I give my informed consent for the services to be provided through the use of telemedicine in my medical care

## 2020-02-13 NOTE — Progress Notes (Signed)
This encounter was created in error - please disregard.

## 2020-02-13 NOTE — Telephone Encounter (Signed)
Daniel Ewing is a 44 y.o. male with a hx of atrial fibrillation, HTN, HLD, and DM II. Patient was diagnosed with atrial fibrillation in September 2019.  He was placed on Xarelto 20 mg daily given CHA2DS2-Vasc score 3 (HTN, DM and LV dysfunction) Echocardiogram obtained on 07/16/2018 showed EF 50 to 55% (EF 45% by Dr. Elvis Coil review), grade 1 DD, mild MR. Repeat echocardiogram obtained on 04/16/2019 showed EF 55 to 60%, moderate LVH, mild diastolic dysfunction.  It appears recently patient has been established with Dr. Kennon Rounds of [facility] cardiology on 12/04/2019.  He was doing well at the time and it was recommended to follow-up with cardiology service in 1 year.  He initially presented today for virtual visit.  Talking with the patient, he says he came to [place] for second opinion as he was having some issues.  In the past 2 weeks, he has been noticing increasing lower extremity edema and intermittent chest pain.  The 2 symptoms came on around the same time.  Chest pain is present in the left chest, and only lasts seconds at a time and does not radiate.  He has roughly one episode of chest pain per day.  It can occur both at rest and with exertion.  He does have intermittent shortness of breath with exertion but not at rest.  There has been no orthopnea and PND episode.  Unfortunately, the extent of virtual visit does not allow me to do a full exam on him or obtain a EKG.  I recommended bringing the patient to the clinic this afternoon for further assessment.  He had attempted to schedule his appointment for this afternoon, he realized that his insurance does not cover visit with [place] and his insurance only work with [facility] system.  He cannot make it to today's visit due to work nor can he afford to have the visit due to financial difficulty. He is planning to see his cardiologist at [facility] instead. I am unable to form a final diagnosis since I do not have a EKG on him nor was I able to do a physical  exam as the visit was virtual.  He is aware that he needs will need to see his [facility] cardiologist as soon as possible for EKG to make sure he has not gone into atrial fibrillation which can cause swelling in the legs.  As for his chest pain, even though it is atypical, he is aware that if his chest pain lasts longer than 15 minutes without relief, he should go to the hospital and seek urgent medical attention.  Since we were unable to complete today's visit, I will cancel the visit.  Safety precaution has been discussed with the patient to seek urgent medical attention if he develop any hypotension or severe tachycardia or prolonged chest pain without relief.  I will also forward this message to his primary cardiologist as well so they are aware.

## 2022-12-28 ENCOUNTER — Ambulatory Visit: Payer: BLUE CROSS/BLUE SHIELD | Admitting: Family Medicine

## 2023-01-05 ENCOUNTER — Ambulatory Visit: Payer: Self-pay | Admitting: Family Medicine

## 2023-02-01 ENCOUNTER — Ambulatory Visit (INDEPENDENT_AMBULATORY_CARE_PROVIDER_SITE_OTHER): Payer: BLUE CROSS/BLUE SHIELD | Admitting: Family Medicine

## 2023-02-01 VITALS — BP 147/96 | HR 82 | Temp 98.1°F | Resp 16 | Ht 69.0 in | Wt 226.6 lb

## 2023-02-01 DIAGNOSIS — N529 Male erectile dysfunction, unspecified: Secondary | ICD-10-CM

## 2023-02-01 DIAGNOSIS — E785 Hyperlipidemia, unspecified: Secondary | ICD-10-CM

## 2023-02-01 DIAGNOSIS — E11649 Type 2 diabetes mellitus with hypoglycemia without coma: Secondary | ICD-10-CM | POA: Diagnosis not present

## 2023-02-01 DIAGNOSIS — I10 Essential (primary) hypertension: Secondary | ICD-10-CM

## 2023-02-01 DIAGNOSIS — Z7689 Persons encountering health services in other specified circumstances: Secondary | ICD-10-CM

## 2023-02-01 MED ORDER — TADALAFIL 5 MG PO TABS: 5.0000 mg | ORAL_TABLET | Freq: Every day | ORAL | 3 refills | Status: DC | PRN

## 2023-02-01 MED ORDER — OZEMPIC (0.25 OR 0.5 MG/DOSE) 2 MG/1.5ML ~~LOC~~ SOPN: 0.5000 mg | PEN_INJECTOR | SUBCUTANEOUS | 1 refills | Status: AC

## 2023-02-01 MED ORDER — ATORVASTATIN CALCIUM 10 MG PO TABS: 10.0000 mg | ORAL_TABLET | Freq: Every day | ORAL | 0 refills | Status: DC

## 2023-02-01 MED ORDER — RIVAROXABAN 20 MG PO TABS: 20.0000 mg | ORAL_TABLET | Freq: Every day | ORAL | 1 refills | Status: DC

## 2023-02-01 MED ORDER — AMLODIPINE BESYLATE 5 MG PO TABS: 5.0000 mg | ORAL_TABLET | Freq: Every day | ORAL | 0 refills | Status: DC

## 2023-02-06 ENCOUNTER — Encounter: Payer: Self-pay | Admitting: Family Medicine

## 2023-02-06 LAB — POCT GLYCOSYLATED HEMOGLOBIN (HGB A1C): Hemoglobin A1C: 14.4 % — AB (ref 4.0–5.6)

## 2023-02-06 NOTE — Progress Notes (Signed)
New Patient Office Visit  Subjective    Patient ID: Daniel Ewing, male    DOB: [birth year]  Age: 47 y.o. MRN: [id number]  CC:  Chief Complaint  Patient presents with   Establish Care    HPI Daniel Ewing presents to establish care and for review of chronic med issues. Patient denies acute complaints.    Outpatient Encounter Medications as of 02/01/2023  Medication Sig   lisinopril (PRINIVIL,ZESTRIL) 40 MG tablet Take 1 tablet (40 mg total) by mouth daily.   metFORMIN (GLUCOPHAGE) 500 MG tablet Take 500 mg by mouth 2 (two) times daily with a meal.   metoprolol tartrate (LOPRESSOR) 50 MG tablet Take 1 tablet (50 mg total) by mouth 2 (two) times daily.   Omega-3 Fatty Acids (FISH OIL) 1000 MG CAPS Take 1,000 mg by mouth 3 (three) times daily.   [DISCONTINUED] amLODipine (NORVASC) 5 MG tablet Take 1 tablet (5 mg total) by mouth daily.   [DISCONTINUED] atorvastatin (LIPITOR) 10 MG tablet Take 10 mg by mouth daily at 6 PM.   [DISCONTINUED] Semaglutide,0.25 or 0.5MG /DOS, (OZEMPIC, 0.25 OR 0.5 MG/DOSE,) 2 MG/1.5ML SOPN Inject into the skin.   [DISCONTINUED] tadalafil (CIALIS) 5 MG tablet Take by mouth.   [DISCONTINUED] XARELTO 20 MG TABS tablet TAKE 1 TABLET BY MOUTH ONCE DAILY WITH SUPPER   amLODipine (NORVASC) 5 MG tablet Take 1 tablet (5 mg total) by mouth daily.   atorvastatin (LIPITOR) 10 MG tablet Take 1 tablet (10 mg total) by mouth daily at 6 PM.   rivaroxaban (XARELTO) 20 MG TABS tablet Take 1 tablet (20 mg total) by mouth daily with supper.   Semaglutide,0.25 or 0.5MG /DOS, (OZEMPIC, 0.25 OR 0.5 MG/DOSE,) 2 MG/1.5ML SOPN Inject 0.5 mg into the skin once a week.   tadalafil (CIALIS) 5 MG tablet Take 1 tablet (5 mg total) by mouth daily as needed for erectile dysfunction.   No facility-administered encounter medications on file as of 02/01/2023.    Past Medical History:  Diagnosis Date   Diabetes mellitus without complication    Hyperlipidemia    Hypertension     History  reviewed. No pertinent surgical history.  Family History  Problem Relation Age of Onset   Cancer Father    Diabetes Father    Hypertension Father    Heart disease Father    Cancer Brother     Social History   Socioeconomic History   Marital status: Single    Spouse name: Not on file   Number of children: Not on file   Years of education: Not on file   Highest education level: Not on file  Occupational History   Occupation: Secondary school teacher  Tobacco Use   Smoking status: Never   Smokeless tobacco: Never  Substance and Sexual Activity   Alcohol use: No   Drug use: No   Sexual activity: Not on file  Other Topics Concern   Not on file  Social History Narrative   Not on file   Social Determinants of Health   Financial Resource Strain: Not on file  Food Insecurity: Not on file  Transportation Needs: Not on file  Physical Activity: Not on file  Stress: Not on file  Social Connections: Not on file  Intimate Partner Violence: Not on file    Review of Systems  All other systems reviewed and are negative.       Objective    BP (!) 147/96   Pulse 82   Temp 98.1 F (36.7 C) (Oral)  Resp 16   Ht  (1.753 m)   Wt 226 lb 9.6 oz (102.8 kg)   SpO2 93%   BMI 33.46 kg/m   Physical Exam Vitals and nursing note reviewed.  Constitutional:      General: He is not in acute distress. Cardiovascular:     Rate and Rhythm: Normal rate and regular rhythm.  Pulmonary:     Effort: Pulmonary effort is normal.     Breath sounds: Normal breath sounds.  Abdominal:     Palpations: Abdomen is soft.     Tenderness: There is no abdominal tenderness.  Neurological:     General: No focal deficit present.     Mental Status: He is alert and oriented to person, place, and time.  Psychiatric:        Mood and Affect: Mood normal.        Behavior: Behavior normal.         Assessment & Plan:   1. Uncontrolled type 2 diabetes mellitus with hypoglycemia without coma Elevated  A1c and not near goal. Will start Lantus. Referral to [facility] for med management.   - POCT glycosylated hemoglobin (Hb A1C)  2. Essential hypertension Readings are slightly elevated. Amlodipine 5 mg prescribed. Monitor.   3. Hyperlipidemia, unspecified hyperlipidemia type Continue. Meds refilled.   4. Erectile dysfunction, unspecified erectile dysfunction type Continue cialis  5. Encounter to establish care     Return in about 3 months (around 05/03/2023).   Tommie Raymond, MD

## 2023-03-06 ENCOUNTER — Other Ambulatory Visit: Payer: Self-pay | Admitting: Family Medicine

## 2023-03-12 ENCOUNTER — Other Ambulatory Visit: Payer: Self-pay | Admitting: Family Medicine

## 2023-03-23 ENCOUNTER — Encounter: Payer: Self-pay | Admitting: Family Medicine

## 2023-03-23 ENCOUNTER — Ambulatory Visit (INDEPENDENT_AMBULATORY_CARE_PROVIDER_SITE_OTHER): Payer: BLUE CROSS/BLUE SHIELD | Admitting: Family Medicine

## 2023-03-23 VITALS — BP 159/92 | HR 74 | Temp 98.1°F | Resp 16 | Wt 234.0 lb

## 2023-03-23 DIAGNOSIS — I10 Essential (primary) hypertension: Secondary | ICD-10-CM

## 2023-03-23 DIAGNOSIS — E785 Hyperlipidemia, unspecified: Secondary | ICD-10-CM | POA: Diagnosis not present

## 2023-03-23 DIAGNOSIS — N529 Male erectile dysfunction, unspecified: Secondary | ICD-10-CM

## 2023-03-23 MED ORDER — AMLODIPINE BESYLATE 10 MG PO TABS: 10.0000 mg | ORAL_TABLET | Freq: Every day | ORAL | 0 refills | Status: AC

## 2023-03-23 MED ORDER — METOPROLOL TARTRATE 50 MG PO TABS: 50.0000 mg | ORAL_TABLET | Freq: Two times a day (BID) | ORAL | 3 refills | Status: AC

## 2023-03-23 MED ORDER — RIVAROXABAN 20 MG PO TABS: 20.0000 mg | ORAL_TABLET | Freq: Every day | ORAL | 1 refills | Status: AC

## 2023-03-23 MED ORDER — ATORVASTATIN CALCIUM 10 MG PO TABS: 10.0000 mg | ORAL_TABLET | Freq: Every day | ORAL | 1 refills | Status: AC

## 2023-03-23 MED ORDER — TADALAFIL 5 MG PO TABS: 5.0000 mg | ORAL_TABLET | Freq: Every day | ORAL | 3 refills | Status: AC | PRN

## 2023-03-23 NOTE — Progress Notes (Signed)
Established Patient Office Visit  Subjective    Patient ID: Daniel Ewing, male    DOB: [birth year]  Age: 47 y.o. MRN: [id number]  CC: No chief complaint on file.   HPI ZURICH ARRON presents for follow up of chronic med issues. Patient denies acute complaints or concerns.    Outpatient Encounter Medications as of 03/23/2023  Medication Sig   amLODipine (NORVASC) 10 MG tablet Take 1 tablet (10 mg total) by mouth daily.   Dulaglutide (TRULICITY) 0.75 MG/0.5ML SOPN Inject into the skin.   glucose blood (PRECISION QID TEST) test strip 1 each by miscellaneous route Once Daily.   omega-3 acid ethyl esters (LOVAZA) 1 g capsule Take by mouth.   amLODipine (NORVASC) 5 MG tablet Take 1 tablet (5 mg total) by mouth daily.   atorvastatin (LIPITOR) 10 MG tablet Take 1 tablet (10 mg total) by mouth daily at 6 PM.   lisinopril (PRINIVIL,ZESTRIL) 40 MG tablet Take 1 tablet (40 mg total) by mouth daily.   metFORMIN (GLUCOPHAGE) 500 MG tablet Take 500 mg by mouth 2 (two) times daily with a meal.   metoprolol tartrate (LOPRESSOR) 50 MG tablet Take 1 tablet (50 mg total) by mouth 2 (two) times daily.   Omega-3 Fatty Acids (FISH OIL) 1000 MG CAPS Take 1,000 mg by mouth 3 (three) times daily.   rivaroxaban (XARELTO) 20 MG TABS tablet Take 1 tablet (20 mg total) by mouth daily with supper.   Semaglutide,0.25 or 0.5MG /DOS, (OZEMPIC, 0.25 OR 0.5 MG/DOSE,) 2 MG/1.5ML SOPN Inject 0.5 mg into the skin once a week.   tadalafil (CIALIS) 5 MG tablet Take 1 tablet (5 mg total) by mouth daily as needed for erectile dysfunction.   [DISCONTINUED] atorvastatin (LIPITOR) 10 MG tablet Take 1 tablet (10 mg total) by mouth daily at 6 PM.   [DISCONTINUED] metoprolol tartrate (LOPRESSOR) 50 MG tablet Take 1 tablet (50 mg total) by mouth 2 (two) times daily.   [DISCONTINUED] rivaroxaban (XARELTO) 20 MG TABS tablet Take 1 tablet (20 mg total) by mouth daily with supper.   [DISCONTINUED] tadalafil (CIALIS) 5 MG tablet Take 1  tablet (5 mg total) by mouth daily as needed for erectile dysfunction.   No facility-administered encounter medications on file as of 03/23/2023.    Past Medical History:  Diagnosis Date   Diabetes mellitus without complication (HCC)    Hyperlipidemia    Hypertension     No past surgical history on file.  Family History  Problem Relation Age of Onset   Cancer Father    Diabetes Father    Hypertension Father    Heart disease Father    Cancer Brother     Social History   Socioeconomic History   Marital status: Single    Spouse name: Not on file   Number of children: Not on file   Years of education: Not on file   Highest education level: Not on file  Occupational History   Occupation: Secondary school teacher  Tobacco Use   Smoking status: Never   Smokeless tobacco: Never  Substance and Sexual Activity   Alcohol use: No   Drug use: No   Sexual activity: Not on file  Other Topics Concern   Not on file  Social History Narrative   Not on file   Social Determinants of Health   Financial Resource Strain: Not on file  Food Insecurity: Not on file  Transportation Needs: Not on file  Physical Activity: Not on file  Stress: Not on file  Social Connections: Not on file  Intimate Partner Violence: Not on file    Review of Systems  All other systems reviewed and are negative.       Objective    BP (!) 159/92   Pulse 74   Temp 98.1 F (36.7 C) (Oral)   Resp 16   Wt 234 lb (106.1 kg)   SpO2 98%   BMI 34.56 kg/m   Physical Exam Vitals and nursing note reviewed.  Constitutional:      General: He is not in acute distress. Cardiovascular:     Rate and Rhythm: Normal rate and regular rhythm.  Pulmonary:     Effort: Pulmonary effort is normal.     Breath sounds: Normal breath sounds.  Abdominal:     Palpations: Abdomen is soft.     Tenderness: There is no abdominal tenderness.  Neurological:     General: No focal deficit present.     Mental Status: He is alert and  oriented to person, place, and time.  Psychiatric:        Mood and Affect: Mood normal.        Behavior: Behavior normal.         Assessment & Plan:   1. Essential hypertension Slightly elevated readings. Compliance discussed. Amlodipine increased from 5mg  to 10 mg.   2. Hyperlipidemia, unspecified hyperlipidemia type Continue   3. Erectile dysfunction, unspecified erectile dysfunction type Cialis refilled.   Return in about 3 weeks (around 04/13/2023).   Tommie Raymond, MD

## 2023-04-06 ENCOUNTER — Ambulatory Visit: Payer: BLUE CROSS/BLUE SHIELD | Admitting: Family Medicine

## 2023-04-20 ENCOUNTER — Ambulatory Visit (INDEPENDENT_AMBULATORY_CARE_PROVIDER_SITE_OTHER): Payer: BLUE CROSS/BLUE SHIELD | Admitting: Family Medicine

## 2023-04-20 VITALS — BP 135/90 | HR 72 | Temp 98.1°F | Resp 16 | Wt 234.4 lb

## 2023-04-20 DIAGNOSIS — Z7985 Long-term (current) use of injectable non-insulin antidiabetic drugs: Secondary | ICD-10-CM

## 2023-04-20 DIAGNOSIS — Z7984 Long term (current) use of oral hypoglycemic drugs: Secondary | ICD-10-CM | POA: Diagnosis not present

## 2023-04-20 DIAGNOSIS — E11649 Type 2 diabetes mellitus with hypoglycemia without coma: Secondary | ICD-10-CM

## 2023-04-20 DIAGNOSIS — Z794 Long term (current) use of insulin: Secondary | ICD-10-CM

## 2023-04-20 DIAGNOSIS — I10 Essential (primary) hypertension: Secondary | ICD-10-CM

## 2023-04-20 MED ORDER — INSULIN PEN NEEDLE 31G X 5 MM MISC: 1.0000 | Freq: Every day | 5 refills | Status: AC

## 2023-04-20 MED ORDER — INSULIN GLARGINE 100 UNITS/ML SOLOSTAR PEN: 15.0000 [IU] | PEN_INJECTOR | Freq: Every day | SUBCUTANEOUS | 5 refills | Status: AC

## 2023-04-20 NOTE — Progress Notes (Unsigned)
Patient is here for 4wk follow-up BP Patent has uncontrolled hypertension Provider is  aware of readings

## 2023-04-25 ENCOUNTER — Encounter: Payer: Self-pay | Admitting: Family Medicine

## 2023-04-25 NOTE — Progress Notes (Signed)
Established Patient Office Visit  Subjective    Patient ID: Daniel Ewing, male    DOB: [birth year]  Age: 47 y.o. MRN: [id number]  CC: No chief complaint on file.   HPI Daniel Ewing presents for follow up of chronic med issues. Patient denies acute complaints.    Outpatient Encounter Medications as of 04/20/2023  Medication Sig   amLODipine (NORVASC) 10 MG tablet Take 1 tablet (10 mg total) by mouth daily.   atorvastatin (LIPITOR) 10 MG tablet Take 1 tablet (10 mg total) by mouth daily at 6 PM.   Dulaglutide (TRULICITY) 0.75 MG/0.5ML SOPN Inject into the skin.   glucose blood (PRECISION QID TEST) test strip 1 each by miscellaneous route Once Daily.   insulin glargine (LANTUS) 100 unit/mL SOPN Inject 15 Units into the skin daily.   Insulin Pen Needle 31G X 5 MM MISC 1 each by Does not apply route at bedtime.   metFORMIN (GLUCOPHAGE) 500 MG tablet Take 500 mg by mouth 2 (two) times daily with a meal.   metoprolol tartrate (LOPRESSOR) 50 MG tablet Take 1 tablet (50 mg total) by mouth 2 (two) times daily.   omega-3 acid ethyl esters (LOVAZA) 1 g capsule Take by mouth.   Omega-3 Fatty Acids (FISH OIL) 1000 MG CAPS Take 1,000 mg by mouth 3 (three) times daily.   rivaroxaban (XARELTO) 20 MG TABS tablet Take 1 tablet (20 mg total) by mouth daily with supper.   Semaglutide,0.25 or 0.5MG /DOS, (OZEMPIC, 0.25 OR 0.5 MG/DOSE,) 2 MG/1.5ML SOPN Inject 0.5 mg into the skin once a week.   tadalafil (CIALIS) 5 MG tablet Take 1 tablet (5 mg total) by mouth daily as needed for erectile dysfunction.   lisinopril (PRINIVIL,ZESTRIL) 40 MG tablet Take 1 tablet (40 mg total) by mouth daily.   [DISCONTINUED] amLODipine (NORVASC) 5 MG tablet Take 1 tablet (5 mg total) by mouth daily. (Patient not taking: Reported on 04/20/2023)   No facility-administered encounter medications on file as of 04/20/2023.    Past Medical History:  Diagnosis Date   Diabetes mellitus without complication (HCC)    Hyperlipidemia     Hypertension     No past surgical history on file.  Family History  Problem Relation Age of Onset   Cancer Father    Diabetes Father    Hypertension Father    Heart disease Father    Cancer Brother     Social History   Socioeconomic History   Marital status: Single    Spouse name: Not on file   Number of children: Not on file   Years of education: Not on file   Highest education level: Not on file  Occupational History   Occupation: Secondary school teacher  Tobacco Use   Smoking status: Never   Smokeless tobacco: Never  Substance and Sexual Activity   Alcohol use: No   Drug use: No   Sexual activity: Not on file  Other Topics Concern   Not on file  Social History Narrative   Not on file   Social Determinants of Health   Financial Resource Strain: Not on file  Food Insecurity: Not on file  Transportation Needs: Not on file  Physical Activity: Not on file  Stress: Not on file  Social Connections: Not on file  Intimate Partner Violence: Not on file    Review of Systems  All other systems reviewed and are negative.       Objective    BP (!) 135/90   Pulse 72  Temp 98.1 F (36.7 C) (Oral)   Resp 16   Wt 234 lb 6.4 oz (106.3 kg)   SpO2 95%   BMI 34.61 kg/m   Physical Exam Vitals and nursing note reviewed.  Constitutional:      General: He is not in acute distress. Cardiovascular:     Rate and Rhythm: Normal rate and regular rhythm.  Pulmonary:     Effort: Pulmonary effort is normal.     Breath sounds: Normal breath sounds.  Abdominal:     Palpations: Abdomen is soft.     Tenderness: There is no abdominal tenderness.  Neurological:     General: No focal deficit present.     Mental Status: He is alert and oriented to person, place, and time.  Psychiatric:        Mood and Affect: Mood normal.        Behavior: Behavior normal.         Assessment & Plan:   1. Uncontrolled type 2 diabetes mellitus with hypoglycemia without coma (HCC) Recent A1c  is elevated. Referral to [facility] for med management. Lantus prescribed. Utilize freestyle.  - Microalbumin / creatinine urine ratio  2. Essential hypertension Improved with present management. Continue and monitor    Return in about 4 weeks (around 05/18/2023) for follow up.   Tommie Raymond, MD

## 2023-06-01 ENCOUNTER — Telehealth: Payer: Self-pay

## 2023-06-01 NOTE — Progress Notes (Signed)
Patient attempted to be outreached by Mack Guise on 06/01/23 to discuss hypertension. Left voicemail for patient to return our call at their convenience at [phone].  Mack Guise, Student-PharmD

## 2023-06-04 ENCOUNTER — Ambulatory Visit: Payer: BLUE CROSS/BLUE SHIELD | Admitting: Family Medicine

## 2023-06-26 ENCOUNTER — Ambulatory Visit: Payer: BLUE CROSS/BLUE SHIELD | Admitting: Family Medicine

## 2023-08-09 ENCOUNTER — Other Ambulatory Visit: Payer: Self-pay | Admitting: Cardiology

## 2023-12-04 ENCOUNTER — Telehealth: Payer: Self-pay | Admitting: Family Medicine

## 2023-12-04 NOTE — Telephone Encounter (Signed)
Patient was identified as falling into the [place] Measure - Diabetes.   Patient was: Appointment scheduled with primary care provider in the next 30 days.  Patient last A1C was 07/03/2023 scheduled patient for 12/10/2023 for physical and A1C check

## 2023-12-10 ENCOUNTER — Encounter: Payer: Self-pay | Admitting: Family Medicine

## 2024-03-24 ENCOUNTER — Other Ambulatory Visit: Payer: Self-pay | Admitting: Family Medicine

## 2024-07-24 ENCOUNTER — Encounter (HOSPITAL_BASED_OUTPATIENT_CLINIC_OR_DEPARTMENT_OTHER): Payer: Self-pay

## 2024-07-24 ENCOUNTER — Other Ambulatory Visit: Payer: Self-pay

## 2024-07-24 ENCOUNTER — Emergency Department (HOSPITAL_BASED_OUTPATIENT_CLINIC_OR_DEPARTMENT_OTHER)

## 2024-07-24 ENCOUNTER — Emergency Department (HOSPITAL_BASED_OUTPATIENT_CLINIC_OR_DEPARTMENT_OTHER)
Admission: EM | Admit: 2024-07-24 | Discharge: 2024-07-24 | Disposition: A | Discharge status: Home / Self Care | Attending: Emergency Medicine | Admitting: Emergency Medicine

## 2024-07-24 DIAGNOSIS — E119 Type 2 diabetes mellitus without complications: Secondary | ICD-10-CM | POA: Diagnosis not present

## 2024-07-24 DIAGNOSIS — Z79899 Other long term (current) drug therapy: Secondary | ICD-10-CM | POA: Diagnosis not present

## 2024-07-24 DIAGNOSIS — I10 Essential (primary) hypertension: Secondary | ICD-10-CM | POA: Insufficient documentation

## 2024-07-24 DIAGNOSIS — M546 Pain in thoracic spine: Secondary | ICD-10-CM | POA: Diagnosis not present

## 2024-07-24 DIAGNOSIS — Z794 Long term (current) use of insulin: Secondary | ICD-10-CM | POA: Insufficient documentation

## 2024-07-24 DIAGNOSIS — Z7901 Long term (current) use of anticoagulants: Secondary | ICD-10-CM | POA: Diagnosis not present

## 2024-07-24 DIAGNOSIS — R10A1 Flank pain, right side: Secondary | ICD-10-CM | POA: Diagnosis present

## 2024-07-24 LAB — CBC WITH DIFFERENTIAL/PLATELET
Abs Immature Granulocytes: 0.02 K/uL (ref 0.00–0.07)
Basophils Absolute: 0 K/uL (ref 0.0–0.1)
Basophils Relative: 0 %
Eosinophils Absolute: 0.1 K/uL (ref 0.0–0.5)
Eosinophils Relative: 2 %
HCT: 42.5 % (ref 39.0–52.0)
Hemoglobin: 14.4 g/dL (ref 13.0–17.0)
Immature Granulocytes: 0 %
Lymphocytes Relative: 38 %
Lymphs Abs: 2.8 K/uL (ref 0.7–4.0)
MCH: 27.6 pg (ref 26.0–34.0)
MCHC: 33.9 g/dL (ref 30.0–36.0)
MCV: 81.4 fL (ref 80.0–100.0)
Monocytes Absolute: 0.5 K/uL (ref 0.1–1.0)
Monocytes Relative: 7 %
Neutro Abs: 4 K/uL (ref 1.7–7.7)
Neutrophils Relative %: 53 %
Platelets: 234 K/uL (ref 150–400)
RBC: 5.22 MIL/uL (ref 4.22–5.81)
RDW: 14.4 % (ref 11.5–15.5)
WBC: 7.5 K/uL (ref 4.0–10.5)
nRBC: 0 % (ref 0.0–0.2)

## 2024-07-24 LAB — URINALYSIS, ROUTINE W REFLEX MICROSCOPIC
Glucose, UA: >=500 mg/dL — AB
Hgb urine dipstick: NEGATIVE
Ketones, ur: 15 mg/dL — AB
Leukocytes,Ua: NEGATIVE
Nitrite: NEGATIVE
Protein, ur: 100 mg/dL — AB
Specific Gravity, Urine: 1.025 (ref 1.005–1.030)
pH: 5.5 (ref 5.0–8.0)

## 2024-07-24 LAB — URINALYSIS, MICROSCOPIC (REFLEX): WBC, UA: NONE SEEN WBC/hpf (ref 0–5)

## 2024-07-24 LAB — COMPREHENSIVE METABOLIC PANEL WITH GFR
ALT: 21 U/L (ref 0–44)
AST: 22 U/L (ref 15–41)
Albumin: 4.2 g/dL (ref 3.5–5.0)
Alkaline Phosphatase: 70 U/L (ref 38–126)
Anion gap: 12 (ref 5–15)
BUN: 9 mg/dL (ref 6–20)
CO2: 25 mmol/L (ref 22–32)
Calcium: 9.1 mg/dL (ref 8.9–10.3)
Chloride: 102 mmol/L (ref 98–111)
Creatinine, Ser: 0.82 mg/dL (ref 0.61–1.24)
GFR, Estimated: >60 mL/min (ref >60)
Glucose, Bld: 212 mg/dL — ABNORMAL HIGH (ref 70–99)
Potassium: 3.5 mmol/L (ref 3.5–5.1)
Sodium: 139 mmol/L (ref 135–145)
Total Bilirubin: 0.4 mg/dL (ref 0.0–1.2)
Total Protein: 7 g/dL (ref 6.5–8.1)

## 2024-07-24 LAB — LIPASE, BLOOD: Lipase: 27 U/L (ref 11–51)

## 2024-07-24 MED ORDER — MORPHINE SULFATE (PF) 4 MG/ML IV SOLN
4.0000 mg | Freq: Once | INTRAVENOUS | Status: AC
  Administered 2024-07-24: 4 mg via INTRAVENOUS
  Filled 2024-07-24: qty 1

## 2024-07-24 MED ORDER — SODIUM CHLORIDE 0.9 % IV BOLUS
1000.0000 mL | Freq: Once | INTRAVENOUS | Status: AC
  Administered 2024-07-24: 1000 mL via INTRAVENOUS

## 2024-07-24 MED ORDER — DIAZEPAM 5 MG PO TABS
5.0000 mg | ORAL_TABLET | Freq: Once | ORAL | Status: AC
  Administered 2024-07-24: 5 mg via ORAL
  Filled 2024-07-24: qty 1

## 2024-07-24 MED ORDER — KETOROLAC TROMETHAMINE 15 MG/ML IJ SOLN
15.0000 mg | Freq: Once | INTRAMUSCULAR | Status: AC
  Administered 2024-07-24: 15 mg via INTRAVENOUS
  Filled 2024-07-24: qty 1

## 2024-07-24 MED ORDER — CYCLOBENZAPRINE HCL 10 MG PO TABS: 10.0000 mg | ORAL_TABLET | Freq: Three times a day (TID) | ORAL | 0 refills | Status: AC

## 2024-07-24 MED ORDER — ONDANSETRON HCL 4 MG/2ML IJ SOLN
4.0000 mg | Freq: Once | INTRAMUSCULAR | Status: AC
  Administered 2024-07-24: 4 mg via INTRAVENOUS
  Filled 2024-07-24: qty 2

## 2024-07-24 NOTE — Telephone Encounter (Signed)
 Copied from CRM #[id number]. Topic: Clinical Concerns - Emergent Call >> Jul 24, 2024  4:47 PM Annabella ORN wrote: Cuyler, Vandyken is calling for clinical concerns (Ask: What symptoms are you calling about today, AND how long have you had these symptoms? Must Review HPKW list for symptoms) Document Name of Triage Nurse/BH Rep taking the call when applicable)   Include all details related to the request(s) below: Patient is calling to report he is experiencing a 10/10 severe pain in his lower back after bending over to put his socks on his feet on 09/30.  Patient is wondering if there are any recommendations prior to his appointment tomorrow with Tari.   Patient states he has had to sleep sitting up in a chair to get any relief.   Confirm and type the Best Contact Number below:  Patient/caller contact number:  [phone]            [] Home  [x] Mobile  [] Work [] Other   [x] Okay to leave a voicemail   Medication List:  Current Outpatient Medications:  .  amLODIPine  (NORVASC ) 10 mg tablet, Take 1 tablet by mouth once daily, Disp: 90 tablet, Rfl: 2 .  atorvastatin  (LIPITOR) 40 mg tablet, Take 1 tablet (40 mg total) by mouth at bedtime., Disp: 90 tablet, Rfl: 3 .  blood-glucose sensor (FreeStyle Libre 3 Sensor) devi, Inject 1 sensor to the skin every 14 days for continuous glucose monitoring., Disp: 2 kit, Rfl: 11 .  glucose blood (Precision Q-I-D Test) test strip, 1 each by miscellaneous route Once Daily., Disp: 100 strip, Rfl: 11 .  insulin  glargine-yfgn (Semglee ,insulin  glarg-yfgn,Pen) 100 unit/mL (3 mL) pen, Inject 24 Units under the skin daily., Disp: 12 mL, Rfl: 2 .  Lancets misc, 1 each by miscellaneous route Once Daily., Disp: 100 each, Rfl: 11 .  lisinopriL  (PRINIVIL ) 40 mg tablet, Take 1 tablet by mouth once daily, Disp: 90 tablet, Rfl: 2 .  metFORMIN (GLUCOPHAGE-XR) 500 mg 24 hr tablet, TAKE 1 TABLET BY MOUTH IN THE MORNING AND 1 IN THE EVENING WITH MEALS, Disp: 180 tablet, Rfl:  0 .  metoprolol  tartrate (LOPRESSOR ) 50 mg tablet, Take 1 tablet by mouth twice daily, Disp: 180 tablet, Rfl: 0 .  omega-3 acid ethyl esters (LOVAZA) 1 gram capsule, Take 1,000 mg by mouth., Disp: , Rfl:  .  rivaroxaban  (Xarelto ) 20 mg tablet, TAKE 1 TABLET BY MOUTH ONCE DAILY WITH  DINNER, Disp: 90 tablet, Rfl: 1 .  Rybelsus 14 mg tab tablet, Take 1 tablet (14 mg total) by mouth daily., Disp: 30 tablet, Rfl: 3 .  tadalafiL  (CIALIS ) 20 mg tablet, TAKE 1 TABLET BY MOUTH ONCE DAILY AS NEEDED FOR ERECTILE DYSFUNCTION, Disp: 90 tablet, Rfl: 0     Medication Request/Refills: Pharmacy Information (if applicable)   [] Not Applicable       []  Pharmacy listed  Send Medication Request to:                                                 [] Pharmacy not listed (added to pharmacy list in Epic) Send Medication Request to:      Listed Pharmacies: [facility] [address], [place] - [address] - PHONE: [phone] - FAX: [phone]

## 2024-07-24 NOTE — ED Triage Notes (Signed)
 Pt states that he has back pain that radiates to the front. States that he has had to sleep in a chair so he could take a deep breath. Denies chest pain. Has been taking aleve  for pain.

## 2024-07-24 NOTE — ED Provider Notes (Signed)
 [facility] Provider Note   CSN: [id number] Arrival date & time: 07/24/24  1727     Patient presents with: Back Pain   Daniel Ewing is a 48 y.o. male.   48 year old male with a past medical history hypertension, diabetes presents to the ED with a chief complaint of right flank pain that has been ongoing for the past 2 days.  Patient describes it as sharp sensation that began when he bent over to reach to his shoes.  Now reports that the pain is dull to his right flank.  There is no alleviating factors, does feel that symptoms exacerbate whenever he is sitting down.  He has tried taking Aleve , Profen to help with symptoms without much improvement.  Does report that the pain is so severe sometimes that takes my breath away . No urinary symptoms, no chest pain, no sob, no fever, nausea or vomiting.   The history is provided by the patient.  Back Pain Associated symptoms: no abdominal pain, no chest pain and no fever        Prior to Admission medications   Medication Sig Start Date End Date Taking? Authorizing Provider  amLODipine  (NORVASC ) 10 MG tablet Take 1 tablet (10 mg total) by mouth daily. 03/23/23   Tanda Bleacher, MD  atorvastatin  (LIPITOR) 10 MG tablet Take 1 tablet (10 mg total) by mouth daily at 6 PM. 03/23/23   Tanda Bleacher, MD  Dulaglutide (TRULICITY) 0.75 MG/0.5ML SOPN Inject into the skin. 03/01/22   [provider]  glucose blood (PRECISION QID TEST) test strip 1 each by miscellaneous route Once Daily. 02/21/19   [provider]  insulin  glargine (LANTUS ) 100 unit/mL SOPN Inject 15 Units into the skin daily. 04/20/23   Tanda Bleacher, MD  Insulin  Pen Needle 31G X 5 MM MISC 1 each by Does not apply route at bedtime. 04/20/23   Tanda Bleacher, MD  lisinopril  (PRINIVIL ,ZESTRIL ) 40 MG tablet Take 1 tablet (40 mg total) by mouth daily. 08/12/18 02/01/23  [place], Peter M, MD  metFORMIN (GLUCOPHAGE) 500 MG tablet Take  500 mg by mouth 2 (two) times daily with a meal.    [provider]  metoprolol  tartrate (LOPRESSOR ) 50 MG tablet Take 1 tablet (50 mg total) by mouth 2 (two) times daily. 03/23/23 03/17/24  Tanda Bleacher, MD  omega-3 acid ethyl esters (LOVAZA) 1 g capsule Take by mouth. 11/04/18   [provider]  Omega-3 Fatty Acids (FISH OIL) 1000 MG CAPS Take 1,000 mg by mouth 3 (three) times daily.    [provider]  rivaroxaban  (XARELTO ) 20 MG TABS tablet Take 1 tablet (20 mg total) by mouth daily with supper. 03/23/23   Tanda Bleacher, MD  Semaglutide ,0.25 or 0.5MG /DOS, (OZEMPIC , 0.25 OR 0.5 MG/DOSE,) 2 MG/1.5ML SOPN Inject 0.5 mg into the skin once a week. 02/01/23   Tanda Bleacher, MD  tadalafil  (CIALIS ) 5 MG tablet Take 1 tablet (5 mg total) by mouth daily as needed for erectile dysfunction. 03/23/23   Tanda Bleacher, MD    Allergies: Patient has no known allergies.    Review of Systems  Constitutional:  Negative for chills and fever.  Respiratory:  Negative for shortness of breath.   Cardiovascular:  Negative for chest pain.  Gastrointestinal:  Negative for abdominal pain, nausea and vomiting.  Genitourinary:  Positive for flank pain.  Musculoskeletal:  Positive for back pain.  All other systems reviewed and are negative.   Updated Vital Signs BP ROLLEN)  128/100 (BP Location: Left Arm)   Pulse 78   Temp (!) 97.5 F (36.4 C) (Oral)   Resp (!) 22   Ht 5' 9 (1.753 m)   Wt 108.9 kg   SpO2 100%   BMI 35.44 kg/m   Physical Exam Vitals and nursing note reviewed.  Constitutional:      Appearance: He is well-developed.  HENT:     Head: Normocephalic and atraumatic.  Eyes:     General: No scleral icterus.    Pupils: Pupils are equal, round, and reactive to light.  Cardiovascular:     Heart sounds: Normal heart sounds.  Pulmonary:     Effort: Pulmonary effort is normal.     Breath sounds: Normal breath sounds. No wheezing.  Chest:     Chest wall: No tenderness.   Abdominal:     General: Bowel sounds are normal. There is no distension.     Palpations: Abdomen is soft.     Tenderness: There is abdominal tenderness. There is right CVA tenderness.     Comments: RLQ tenderness with palpation.   Musculoskeletal:        General: No tenderness or deformity.     Cervical back: Normal range of motion.  Skin:    General: Skin is warm and dry.  Neurological:     Mental Status: He is alert and oriented to person, place, and time.     (all labs ordered are listed, but only abnormal results are displayed) Labs Reviewed  URINALYSIS, ROUTINE W REFLEX MICROSCOPIC - Abnormal; Notable for the following components:      Result Value   Glucose, UA >=500 (*)    Bilirubin Urine SMALL (*)    Ketones, ur 15 (*)    Protein, ur 100 (*)    All other components within normal limits  COMPREHENSIVE METABOLIC PANEL WITH GFR - Abnormal; Notable for the following components:   Glucose, Bld 212 (*)    All other components within normal limits  URINALYSIS, MICROSCOPIC (REFLEX) - Abnormal; Notable for the following components:   Bacteria, UA RARE (*)    All other components within normal limits  CBC WITH DIFFERENTIAL/PLATELET  LIPASE, BLOOD    EKG: None  Radiology: No results found.    Procedures   Medications Ordered in the ED  sodium chloride 0.9 % bolus 1,000 mL (0 mLs Intravenous Stopped 07/24/24 2030)  morphine (PF) 4 MG/ML injection 4 mg (4 mg Intravenous Given 07/24/24 1929)  ondansetron (ZOFRAN) injection 4 mg (4 mg Intravenous Given 07/24/24 1929)  diazepam (VALIUM) tablet 5 mg (5 mg Oral Given 07/24/24 2101)  ketorolac (TORADOL) 15 MG/ML injection 15 mg (15 mg Intravenous Given 07/24/24 2100)                                    Medical Decision Making Amount and/or Complexity of Data Reviewed Labs: ordered. Radiology: ordered.  Risk Prescription drug management.   This patient presents to the ED for concern of back pain, this involves a number  of treatment options, and is a complaint that carries with it a high risk of complications and morbidity.  The differential diagnosis includes renal colic, cholelithiasis, enthesitis versus MSK.   Co morbidities: Discussed in HPI   Brief History:  See HPI.  EMR reviewed including pt PMHx, past surgical history and past visits to ER.   See HPI for more details   Lab Tests:  I ordered and independently interpreted labs.  The pertinent results include:    I personally reviewed all laboratory work and imaging. Metabolic panel without any acute abnormality specifically kidney function within normal limits and no significant electrolyte abnormalities. CBC without leukocytosis or significant anemia.  Imaging Studies:  CT renal study: IMPRESSION:  1. No urinary tract calculi or obstructive uropathy.  2. No acute intra-abdominal or pelvic abnormality.   Medicines ordered:  I ordered medication including Zofran, bolus, morphine for symptomatic treatment. Reevaluation of the patient after these medicines showed that the patient improved I have reviewed the patients home medicines and have made adjustments as needed Patient also had additional medication such as Valium, Toradol to help with likely MSK and improvement in symptoms.  Reevaluation:  After the interventions noted above I re-evaluated patient and found that they have :improved  Social Determinants of Health:  The patient's social determinants of health were a factor in the care of this patient  Problem List / ED Course:  Patient arrives to the ED with a chief complaint of right flank pain which has been going for the past 2 days exacerbated with any type of movement along with bending over especially whenever he is reaching to tie his shoes.  He reports that they also worsen whenever he tries to sit down.  Some concern for renal colic versus MSK.  He is not having any shortness of breath or chest pain at this time.  No  tachycardia, no tachypnea, no hypoxia to be concern for pulmonary embolism at this time.  Blood work overall reassuring.  CBC with no leukocytosis, hemoglobin is within normal limits.  CMP without any electrolyte derangement, creatinine level is within normal limits.  LFTs are normal, total bili is also normal, lower suspicion for gallbladder etiology. UA does have some glucose present, some bili along with protein but no blood, or signs of infection. Multiple rounds of medicine such as morphine, Zofran, bolus with mild improvement in symptoms. CT renal stone study did not show any acute findings at this time, no signs appendicitis, no gallstones noted or kidney stones noted.  Did have improvement after Valium, strong suspicion for MSK origin.  We discussed with patient short course of muscle relaxers, follow-up closely with PCP.  He is otherwise in stable condition, no emergent further workup needed.   Dispostion:  After consideration of the diagnostic results and the patients response to treatment, I feel that the patent would benefit from supportive treatment for pain control.   Portions of this note were generated with Scientist, clinical (histocompatibility and immunogenetics). Dictation errors may occur despite best attempts at proofreading.   Final diagnoses:  Acute right-sided thoracic back pain    ED Discharge Orders          Ordered    cyclobenzaprine (FLEXERIL) 10 MG tablet  3 times daily        07/24/24 2236               Brizeyda Holtmeyer, PA-C 08/02/24 1602    Lenor Hollering, MD 08/03/24 [phone]

## 2024-07-24 NOTE — Telephone Encounter (Signed)
 Pt has appt tomorrow,  advised if he cannot wait til them he should go to the ER so they can do scans

## 2024-07-24 NOTE — Discharge Instructions (Addendum)
 The CT of your kidneys did not show any acute findings today.  Your blood work was within normal limits.  You were given interpretation for muscle relaxers, please take 1 tablet 3 times a day for the next 7 days.  Please be aware this medication can make you drowsy.
